# Patient Record
Sex: Male | Born: 1970 | Race: White | Hispanic: No | Marital: Married | Smoking: Never smoker
Health system: Southern US, Community
[De-identification: ages and names within clinical notes are randomized; demographics above are authoritative.]

## PROBLEM LIST (undated history)

## (undated) DIAGNOSIS — Q211 Atrial septal defect: Secondary | ICD-10-CM

## (undated) DIAGNOSIS — Z8673 Personal history of transient ischemic attack (TIA), and cerebral infarction without residual deficits: Secondary | ICD-10-CM

## (undated) DIAGNOSIS — J302 Other seasonal allergic rhinitis: Secondary | ICD-10-CM

## (undated) HISTORY — PX: INGUINAL HERNIA REPAIR: SUR1180

## (undated) HISTORY — DX: Atrial septal defect: Q21.1

## (undated) HISTORY — DX: Personal history of transient ischemic attack (TIA), and cerebral infarction without residual deficits: Z86.73

## (undated) HISTORY — DX: Other seasonal allergic rhinitis: J30.2

---

## 2003-06-16 DIAGNOSIS — Q211 Atrial septal defect: Secondary | ICD-10-CM

## 2003-06-16 DIAGNOSIS — Q2112 Patent foramen ovale: Secondary | ICD-10-CM

## 2003-06-16 HISTORY — DX: Patent foramen ovale: Q21.12

## 2003-06-16 HISTORY — DX: Atrial septal defect: Q21.1

## 2004-03-15 DIAGNOSIS — Z8673 Personal history of transient ischemic attack (TIA), and cerebral infarction without residual deficits: Secondary | ICD-10-CM

## 2004-03-15 HISTORY — DX: Personal history of transient ischemic attack (TIA), and cerebral infarction without residual deficits: Z86.73

## 2004-03-19 DIAGNOSIS — Z8673 Personal history of transient ischemic attack (TIA), and cerebral infarction without residual deficits: Secondary | ICD-10-CM | POA: Insufficient documentation

## 2004-03-27 ENCOUNTER — Encounter: Admission: RE | Admit: 2004-03-27 | Discharge: 2004-03-27 | Payer: Self-pay | Admitting: Internal Medicine

## 2004-03-28 ENCOUNTER — Encounter: Admission: RE | Admit: 2004-03-28 | Discharge: 2004-03-28 | Payer: Self-pay | Admitting: Internal Medicine

## 2004-03-29 ENCOUNTER — Inpatient Hospital Stay (HOSPITAL_COMMUNITY): Admission: EM | Admit: 2004-03-29 | Discharge: 2004-04-01 | Payer: Self-pay | Admitting: Emergency Medicine

## 2004-03-31 ENCOUNTER — Encounter: Payer: Self-pay | Admitting: Cardiology

## 2004-05-07 ENCOUNTER — Ambulatory Visit: Payer: Self-pay | Admitting: Internal Medicine

## 2004-07-15 ENCOUNTER — Ambulatory Visit: Payer: Self-pay | Admitting: Internal Medicine

## 2004-11-20 ENCOUNTER — Ambulatory Visit: Payer: Self-pay | Admitting: Internal Medicine

## 2004-11-28 ENCOUNTER — Emergency Department (HOSPITAL_COMMUNITY): Admission: EM | Admit: 2004-11-28 | Discharge: 2004-11-29 | Payer: Self-pay | Admitting: Emergency Medicine

## 2004-12-03 ENCOUNTER — Ambulatory Visit: Payer: Self-pay | Admitting: Internal Medicine

## 2004-12-09 ENCOUNTER — Ambulatory Visit: Payer: Self-pay

## 2005-01-01 ENCOUNTER — Ambulatory Visit: Payer: Self-pay | Admitting: Internal Medicine

## 2005-01-08 ENCOUNTER — Ambulatory Visit: Payer: Self-pay | Admitting: Internal Medicine

## 2006-08-02 ENCOUNTER — Ambulatory Visit: Payer: Self-pay | Admitting: Internal Medicine

## 2006-09-16 ENCOUNTER — Ambulatory Visit: Payer: Self-pay | Admitting: Internal Medicine

## 2006-10-23 IMAGING — CR DG CHEST 2V
2 series · 2 of 2 positions shown · non-contrast
Comparison: none

CLINICAL DATA: Chest pain.  
 PA AND LATERAL CHEST:

[view not recorded (1 of 2)]
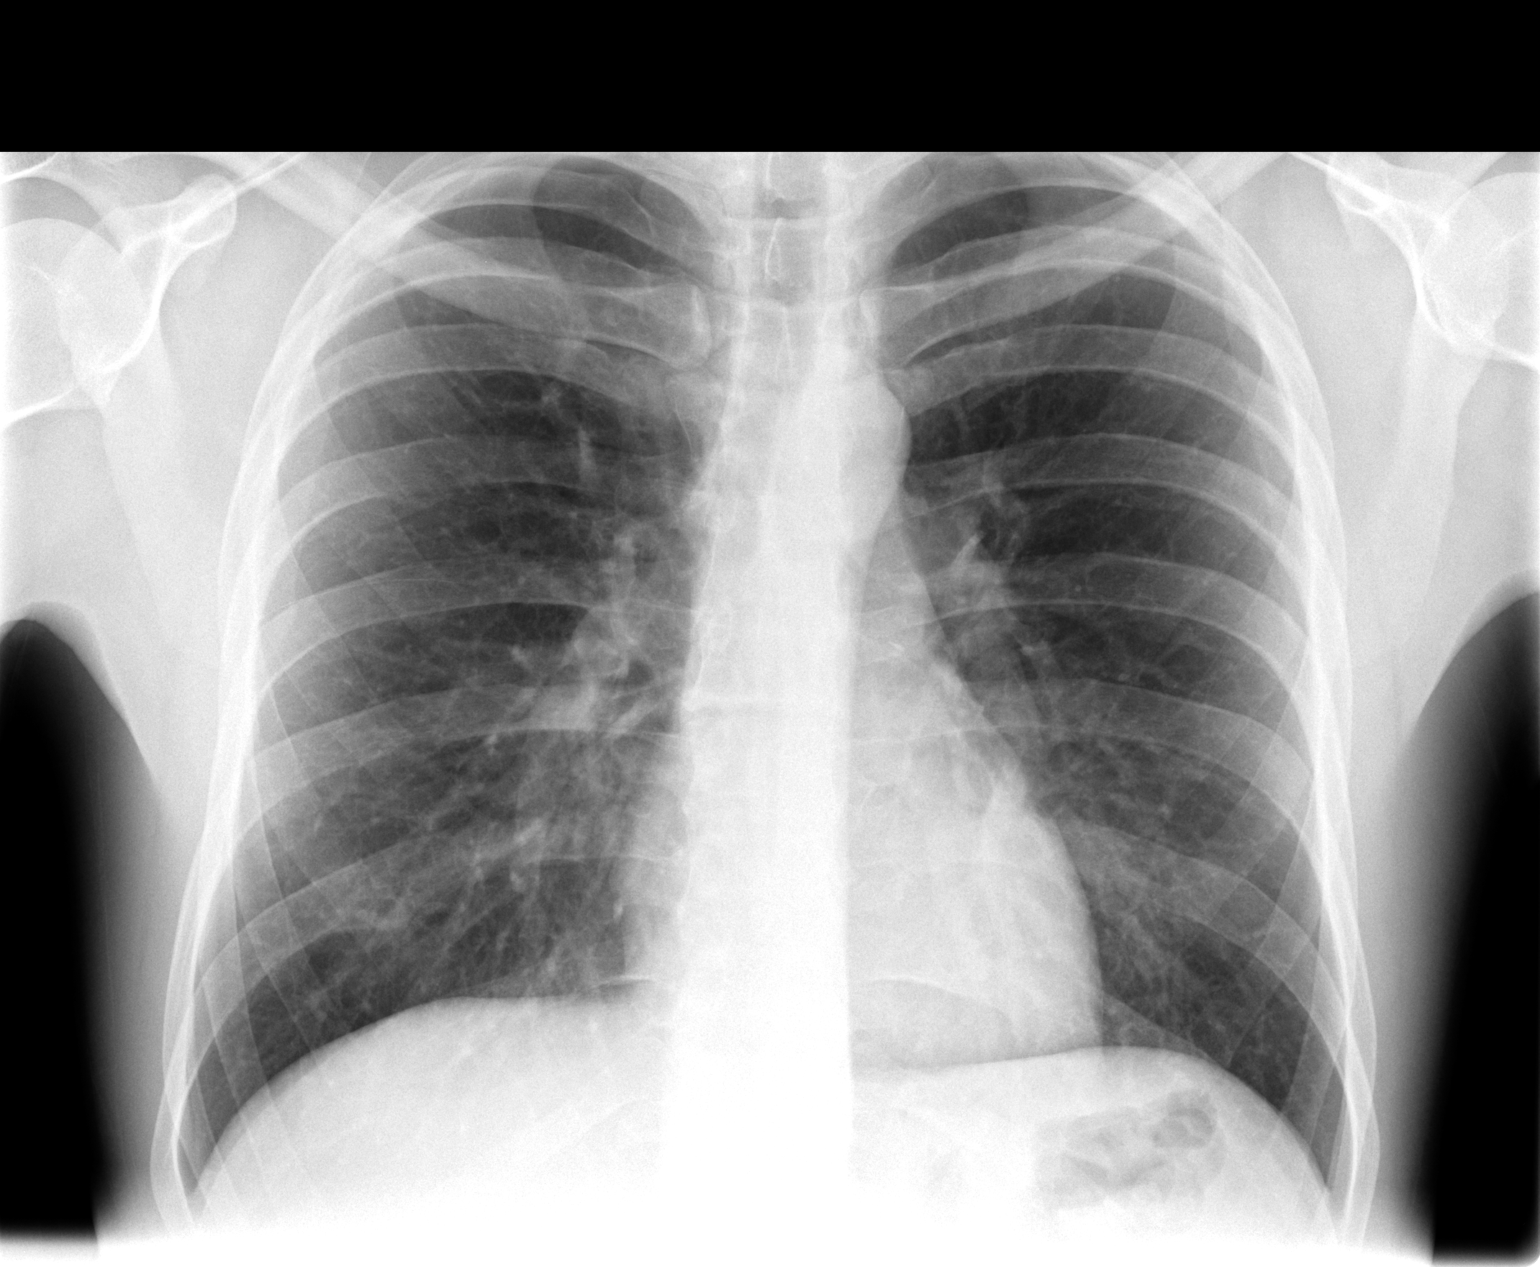

[view not recorded (2 of 2)]
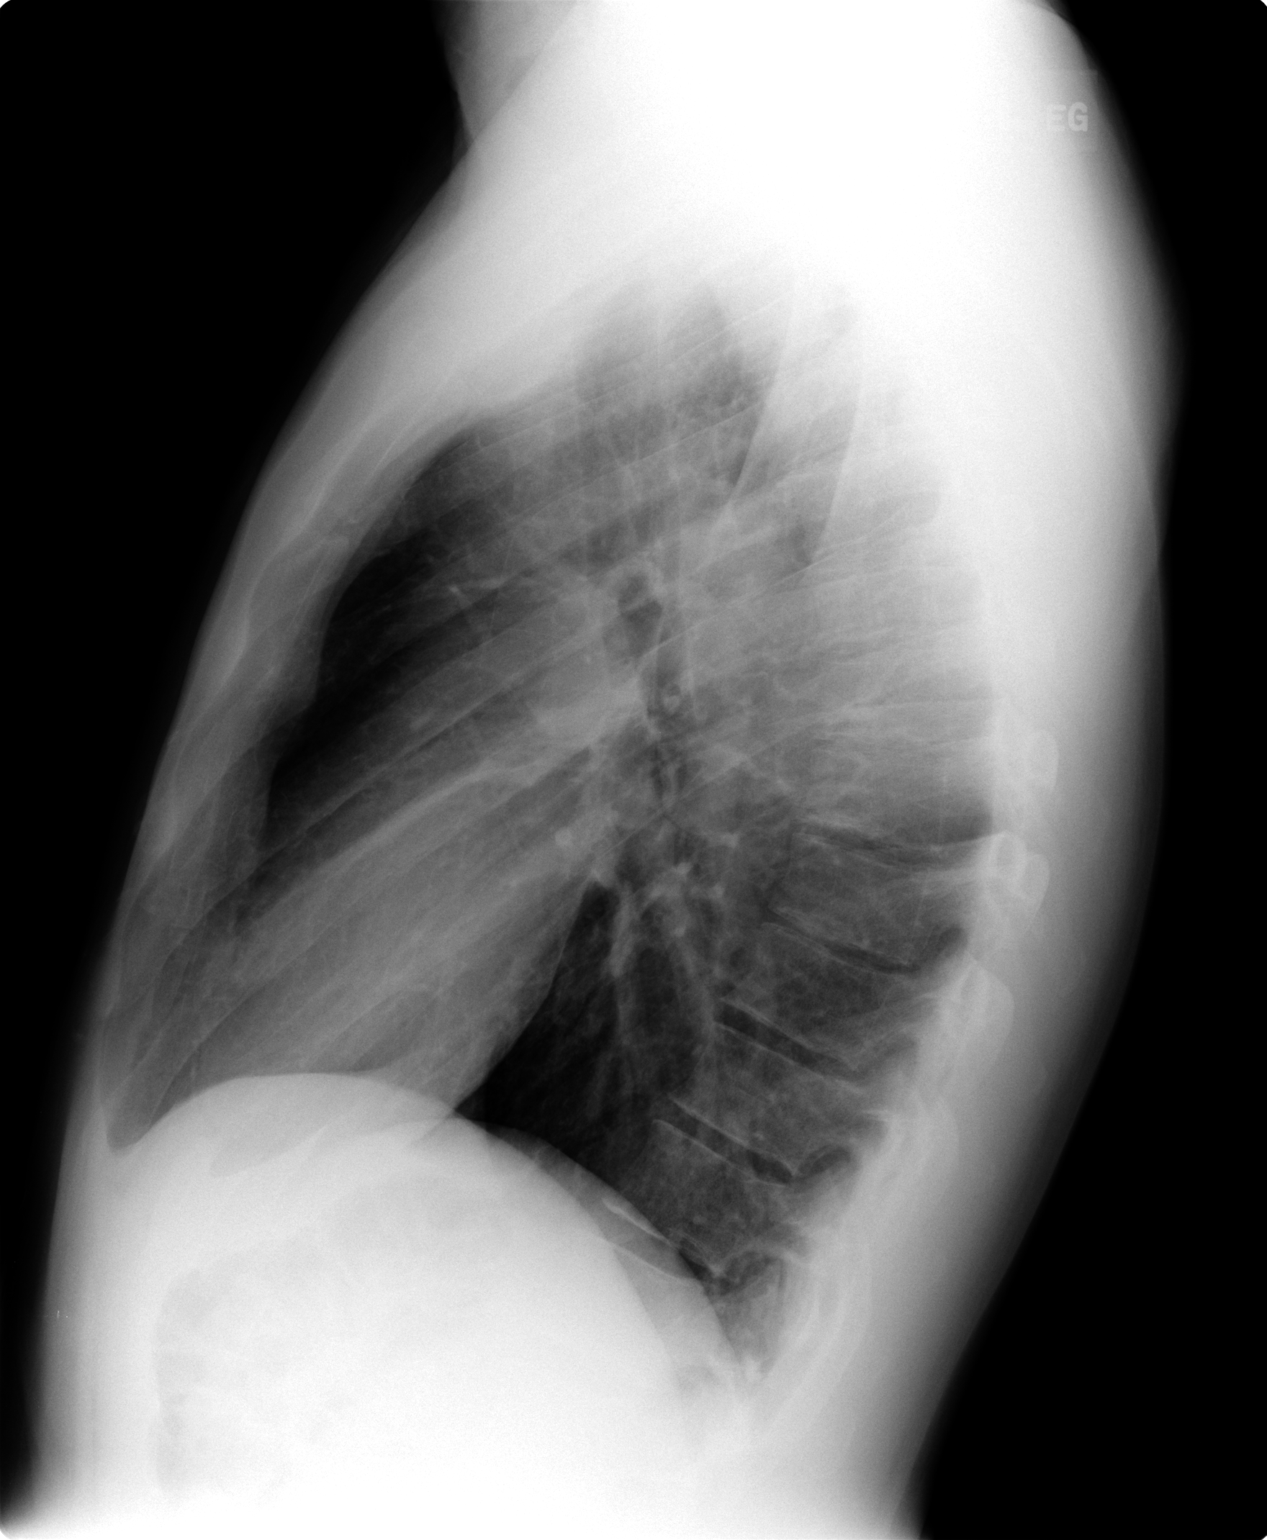

[2 of 2 positions shown; findings below may reference images not displayed]

FINDINGS: Lungs are clear.  Heart size is normal.  No effusion.
IMPRESSION: No acute disease.

## 2006-12-20 ENCOUNTER — Ambulatory Visit: Payer: Self-pay | Admitting: Internal Medicine

## 2006-12-20 DIAGNOSIS — E785 Hyperlipidemia, unspecified: Secondary | ICD-10-CM | POA: Insufficient documentation

## 2006-12-27 ENCOUNTER — Encounter (INDEPENDENT_AMBULATORY_CARE_PROVIDER_SITE_OTHER): Payer: Self-pay | Admitting: *Deleted

## 2008-01-23 ENCOUNTER — Ambulatory Visit: Payer: Self-pay | Admitting: Internal Medicine

## 2008-01-23 DIAGNOSIS — R5383 Other fatigue: Secondary | ICD-10-CM

## 2008-01-23 DIAGNOSIS — R5381 Other malaise: Secondary | ICD-10-CM

## 2008-01-23 DIAGNOSIS — L719 Rosacea, unspecified: Secondary | ICD-10-CM | POA: Insufficient documentation

## 2008-01-30 ENCOUNTER — Encounter (INDEPENDENT_AMBULATORY_CARE_PROVIDER_SITE_OTHER): Payer: Self-pay | Admitting: *Deleted

## 2008-02-08 ENCOUNTER — Encounter (INDEPENDENT_AMBULATORY_CARE_PROVIDER_SITE_OTHER): Payer: Self-pay | Admitting: *Deleted

## 2008-02-08 ENCOUNTER — Ambulatory Visit: Payer: Self-pay | Admitting: Internal Medicine

## 2008-02-08 LAB — CONVERTED CEMR LAB
OCCULT 1: NEGATIVE
OCCULT 2: NEGATIVE
OCCULT 3: NEGATIVE

## 2008-02-16 LAB — CONVERTED CEMR LAB
ALT: 23 units/L (ref 0–53)
AST: 23 units/L (ref 0–37)
Albumin: 4.4 g/dL (ref 3.5–5.2)
Alkaline Phosphatase: 53 units/L (ref 39–117)
BUN: 18 mg/dL (ref 6–23)
Basophils Absolute: 0.2 10*3/uL — ABNORMAL HIGH (ref 0.0–0.1)
Basophils Relative: 3.1 % — ABNORMAL HIGH (ref 0.0–3.0)
Bilirubin, Direct: 0.1 mg/dL (ref 0.0–0.3)
CO2: 30 meq/L (ref 19–32)
Calcium: 9.3 mg/dL (ref 8.4–10.5)
Chloride: 103 meq/L (ref 96–112)
Creatinine, Ser: 1.2 mg/dL (ref 0.4–1.5)
Eosinophils Absolute: 0.3 10*3/uL (ref 0.0–0.7)
Eosinophils Relative: 6.6 % — ABNORMAL HIGH (ref 0.0–5.0)
Free T4: 0.7 ng/dL (ref 0.6–1.6)
GFR calc Af Amer: 88 mL/min
GFR calc non Af Amer: 72 mL/min
Glucose, Bld: 106 mg/dL — ABNORMAL HIGH (ref 70–99)
HCT: 42.4 % (ref 39.0–52.0)
Hemoglobin: 14.7 g/dL (ref 13.0–17.0)
Lymphocytes Relative: 42.3 % (ref 12.0–46.0)
MCHC: 34.8 g/dL (ref 30.0–36.0)
MCV: 94.4 fL (ref 78.0–100.0)
Monocytes Absolute: 0.8 10*3/uL (ref 0.1–1.0)
Monocytes Relative: 19.1 % — ABNORMAL HIGH (ref 3.0–12.0)
Neutro Abs: 1.4 10*3/uL (ref 1.4–7.7)
Neutrophils Relative %: 28.9 % — ABNORMAL LOW (ref 43.0–77.0)
Platelets: 323 10*3/uL (ref 150–400)
Potassium: 4.1 meq/L (ref 3.5–5.1)
RBC: 4.49 M/uL (ref 4.22–5.81)
RDW: 11.9 % (ref 11.5–14.6)
Sodium: 140 meq/L (ref 135–145)
TSH: 3.73 microintl units/mL (ref 0.35–5.50)
Total Bilirubin: 0.9 mg/dL (ref 0.3–1.2)
Total Protein: 7.3 g/dL (ref 6.0–8.3)
WBC: 5 10*3/uL (ref 4.5–10.5)

## 2008-02-28 ENCOUNTER — Ambulatory Visit: Payer: Self-pay | Admitting: Internal Medicine

## 2008-02-29 ENCOUNTER — Encounter (INDEPENDENT_AMBULATORY_CARE_PROVIDER_SITE_OTHER): Payer: Self-pay | Admitting: *Deleted

## 2008-02-29 LAB — CONVERTED CEMR LAB
Basophils Absolute: 0 10*3/uL (ref 0.0–0.1)
Basophils Relative: 0.6 % (ref 0.0–3.0)
Eosinophils Absolute: 0.4 10*3/uL (ref 0.0–0.7)
Eosinophils Relative: 7.1 % — ABNORMAL HIGH (ref 0.0–5.0)
HCT: 43 % (ref 39.0–52.0)
Hemoglobin: 15.1 g/dL (ref 13.0–17.0)
Hgb A1c MFr Bld: 5.2 % (ref 4.6–6.0)
Lymphocytes Relative: 34.7 % (ref 12.0–46.0)
MCHC: 35.1 g/dL (ref 30.0–36.0)
MCV: 93.5 fL (ref 78.0–100.0)
Monocytes Absolute: 0.6 10*3/uL (ref 0.1–1.0)
Monocytes Relative: 11.2 % (ref 3.0–12.0)
Neutro Abs: 2.5 10*3/uL (ref 1.4–7.7)
Neutrophils Relative %: 46.4 % (ref 43.0–77.0)
Platelets: 299 10*3/uL (ref 150–400)
RBC: 4.6 M/uL (ref 4.22–5.81)
RDW: 12 % (ref 11.5–14.6)
WBC: 5.3 10*3/uL (ref 4.5–10.5)

## 2010-07-13 LAB — CONVERTED CEMR LAB
ALT: 20 units/L (ref 0–53)
AST: 20 units/L (ref 0–37)
Albumin: 4.4 g/dL (ref 3.5–5.2)
Alkaline Phosphatase: 50 units/L (ref 39–117)
BUN: 13 mg/dL (ref 6–23)
Basophils Absolute: 0 10*3/uL (ref 0.0–0.1)
Basophils Relative: 0 % (ref 0.0–1.0)
Bilirubin, Direct: 0.1 mg/dL (ref 0.0–0.3)
CO2: 34 meq/L — ABNORMAL HIGH (ref 19–32)
Calcium: 9.2 mg/dL (ref 8.4–10.5)
Chloride: 106 meq/L (ref 96–112)
Cholesterol: 167 mg/dL (ref 0–200)
Creatinine, Ser: 1.1 mg/dL (ref 0.4–1.5)
Eosinophils Absolute: 0.3 10*3/uL (ref 0.0–0.6)
Eosinophils Relative: 5.2 % — ABNORMAL HIGH (ref 0.0–5.0)
GFR calc Af Amer: 98 mL/min
GFR calc non Af Amer: 81 mL/min
Glucose, Bld: 101 mg/dL — ABNORMAL HIGH (ref 70–99)
HCT: 44.5 % (ref 39.0–52.0)
HDL: 37.6 mg/dL — ABNORMAL LOW (ref >39.0)
Hemoglobin: 15 g/dL (ref 13.0–17.0)
LDL Cholesterol: 111 mg/dL — ABNORMAL HIGH (ref 0–99)
Lymphocytes Relative: 31.9 % (ref 12.0–46.0)
MCHC: 33.8 g/dL (ref 30.0–36.0)
MCV: 93 fL (ref 78.0–100.0)
Monocytes Absolute: 0.5 10*3/uL (ref 0.2–0.7)
Monocytes Relative: 10.2 % (ref 3.0–11.0)
Neutro Abs: 2.7 10*3/uL (ref 1.4–7.7)
Neutrophils Relative %: 52.7 % (ref 43.0–77.0)
Platelets: 304 10*3/uL (ref 150–400)
Potassium: 4.1 meq/L (ref 3.5–5.1)
RBC: 4.78 M/uL (ref 4.22–5.81)
RDW: 11.9 % (ref 11.5–14.6)
Sodium: 142 meq/L (ref 135–145)
TSH: 5.18 microintl units/mL (ref 0.35–5.50)
Total Bilirubin: 1.1 mg/dL (ref 0.3–1.2)
Total CHOL/HDL Ratio: 4.4
Total Protein: 7.4 g/dL (ref 6.0–8.3)
Triglycerides: 92 mg/dL (ref 0–149)
VLDL: 18 mg/dL (ref 0–40)
WBC: 5.2 10*3/uL (ref 4.5–10.5)

## 2010-10-31 NOTE — Discharge Summary (Signed)
NAME:  Andrew [name], Andrew [name]                 ACCOUNT NO.:  [id number]   MEDICAL RECORD NO.:  [id number]          PATIENT TYPE:  INP   LOCATION:  3011                         FACILITY:  [facility]   PHYSICIAN:  Rene Paci, M.D. LHCDATE OF BIRTH:  Jun 25, 1970   DATE OF ADMISSION:  03/29/2004  DATE OF DISCHARGE:  04/01/2004                                 DISCHARGE SUMMARY   DISCHARGE DIAGNOSES:  1.  Left mid brain small infarct.  2.  Small patent foramen ovale.  3.  Hyperglycemia.  4.  Elevated triglycerides.   BRIEF ADMISSION HISTORY:  Andrew [name] is a 41 year old white male who started  having symptoms of left facial numbness and tingling in his left fingertips.   PAST MEDICAL HISTORY:  1.  Hypothyroidism.  2.  Inguinal hernia repair.   HOSPITAL COURSE:  Problem 1. Neurological.  The patient presented with left  facial numbness and weakness.  The patient underwent MRI that revealed right  lateral medullary lesion.  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  MRI of brain showed no aneurysms or basilar disease.  His sed  rate, RPR, ANA, FLP, TSH, and homocysteine levels were all normal.  The  patient did undergo a cerebral arteriogram that revealed no dissection,  stenosis, occlusion, AVMs, or aneurysm.  It was suspected that he had a left  mid brain small infarct, the etiology was not known.  Unrelated, the  patient's PEE did reveal a PFO but it was not felt to be the source of his  stroke.   LABS AT DISCHARGE:  Hemoglobin 15.1.  Sed rate was 3.  Coags were normal.  Triglycerides were elevated at 152.   DISCHARGE MEDICATIONS:  1.  Aggrenox one tablet q.h.s. x2 weeks, then start b.i.d.  2.  Synthroid as at home.   Follow up with Dr. Pearlean Brownie in about 6 to 8 weeks and follow up with Dr. Alwyn Ren  as previously scheduled.      LC/MEDQ  D:  07/21/2004  T:  07/21/2004  Job:  [id number]

## 2010-10-31 NOTE — Consult Note (Signed)
NAME:  Andrew [name], Andrew [name]                 ACCOUNT NO.:  [id number]   MEDICAL RECORD NO.:  [id number]          PATIENT TYPE:  INP   LOCATION:  3011                         FACILITY:  [facility]   PHYSICIAN:  Pramod P. Pearlean Brownie, MD    DATE OF BIRTH:  1970-08-08   DATE OF CONSULTATION:  03/31/2004  DATE OF DISCHARGE:                                   CONSULTATION   REASON FOR CONSULTATION:  This 40 year old male who developed the sudden  onset of left face, hand and foot paresthesias in a cheiro-oral pattern one  week ago.   HISTORY OF PRESENT ILLNESS:  The patient states that he was sleeping on  Sunday night in an awkward position on his side with his neck extended.  When he woke up in the morning, he noticed some numbness involving the left  side of his lips and cheeks, as well as his fingertips and the bottom of his  left foot.  He has persisted since then, though it has improved slightly  over the last couple of days.  Today he has only paresthesia effecting his  left fingertips.  He also noted that his balance and equilibrium were off in  the first couple of days, but this seems to be improving.  He denies any  significant headache, neck injury, trauma, no prolonged bout of coughing or  deep venous thrombosis.  There is no family history of strokes or myocardial  infarctions at a young age.  He has had no abnormal clotting tendencies.   PAST MEDICAL HISTORY:  Significant for hypothyroidism.   PAST SURGICAL HISTORY:  Inguinal hernia.   FAMILY HISTORY:  Not significant for any relatives with strokes or  myocardial infarctions at a young age.   SOCIAL HISTORY:  He does not smoke, drink, or do drugs.   MEDICATIONS:  Synthroid.   REVIEW OF SYSTEMS:  Not significant for any recent fever, cough, chest pain,  diarrhea.   PHYSICAL EXAMINATION:  GENERAL:  A pleasant, well-built young Caucasian  male, not in distress.  VITAL SIGNS:  He is afebrile, pulse 80 per minute and regular, blood  pressure  113/76, respirations 20 per minute, saturation 98% on room air.  HEENT:  Head atraumatic.  NECK:  Supple without bruits.  NEUROLOGIC:  The patient is awake, alert and oriented x3.  Normal speech and  language function.  No seizure or apraxia or dysarthria.  Pupils equal,  reactive to light and accommodation.  Visual acuity appears adequate.  Face  is symmetrical.  Palate is normal.  Tongue is midline.  Motor system  examination reveals symmetric upper and lower extremities,  strength, tone,  reflexes coordination and sensation.  He has some subjective paresthesia in  the left fingertips without objective sensory loss.  Finger-to-nose and knee-  to-heel coordination are not recorded, due to not being tested.   DATA:  An MRI scan of the brain done yesterday reveals a small patchy  infarction of the right lateral mid-brain.  The MRA reveals no significant  stenosis of the vertebral basilar artery.  There is mild signal loss in  both  the supraclinoid ICA and the left carotid bifurcation.   LABORATORY DATA:  The hypercoagulable labs and lipid profile are pending.   IMPRESSION:  A 40 year old male with right mid-brain infarction, of  undetermined etiology at this point.  He has no significant prior risk  factors.  A small dissection of the right vertebral basilar artery is  certainly a possibility; however, paroxysmal embolism and insult are to be  considered, consider his young age.   PLAN:  A transcranial Doppler a bubble study was performed after a verbal  informed consent from the patient, explaining the risks and benefits.  The  right middle cerebral artery was __________ and the wave form appeared  normal.  A mixture of __________ saline was injected into the IV which had  previously been inserted into the left forearm.  A high intensity transient  signal or HIDS hits were noted at rest.  These increased when the patient  was asked to perform a Valsalva maneuver.  The maneuver was  repeated twice  and the results were confirmed.  The TCD bubble study is strongly positive,  indicative of an abnormal right to left intracardiac communication at rest.  I discussed the results with the patient and with his wife, and answered  questions.  The patient is scheduled to undergo a transesophageal  echocardiogram to confirm these results, and to further delineate the  anatomy of the inferior third septum.  I discussed in brief the medical  treatment versus endovascular closure of the above with the family, but this  can be done electively.  The rest of the stroke workup is pending.  I would  recommend doing a lower extremity venous Doppler, to rule out a deep venous  thrombosis, and if this is positive, the patient may need to be on  anticoagulation with heparin and Coumadin.  Thank you for this referral.       PPS/MEDQ  D:  03/31/2004  T:  03/31/2004  Job:  [id number]

## 2010-10-31 NOTE — H&P (Signed)
NAME:  Andrew [name], Andrew [name]                 ACCOUNT NO.:  [id number]   MEDICAL RECORD NO.:  [id number]          PATIENT TYPE:  EMS   LOCATION:  MAJO                         FACILITY:  [facility]   PHYSICIAN:  Thomos Lemons, D.O. [facility]   DATE OF BIRTH:  [birth year]   DATE OF ADMISSION:  03/29/2004  DATE OF DISCHARGE:                                HISTORY & PHYSICAL   PRIMARY CARE DOCTOR:  Dr. Alwyn Ren.   CHIEF COMPLAINT:  Left facial, hand and leg numbness.   HISTORY OF PRESENT ILLNESS:  The patient is a 40 year old white male with a  past medical history of hypothyroidism who started having symptoms of left  facial numbness, tingling of his left fingers, and also left leg numbness  after taking a nap on the couch approximately one week ago on Sunday.  The  patient states that his symptoms persisted x 1 week.  Initially it was felt  that there may have been a pinched nerve in his neck.  However, the patient  was sent for an MRI of his brain yesterday, and I was called by the  radiologist today regarding a finding of an acute mesencephalic lesion  infarct, 1 cm, today, and infarct seems to be consistent with patient's  symptoms.   PAST MEDICAL HISTORY:  Only significant for hypothyroidism.  The patient  denies a history of diabetes or hypertension or any cardiac disease.   PAST SURGICAL HISTORY:  Inguinal hernia repair as an infant.   FAMILY HISTORY:  Only significant for a brother who has diabetes.   SOCIAL HISTORY:  The patient denies any tobacco or alcohol use and denies  any drug use.  He is married, has one young daughter, and his occupation is  works for the [employer], community and also does some occasional  Soil scientist.  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   ALLERGIES:  THE PATIENT DENIES ANY KNOWN DRUG ALLERGIES.   MEDICATIONS:  1.  Synthroid 0.5 mg on Tuesday, Thursday, and Saturdays and a half tab on      Mondays, Wednesdays, Fridays and  Sunday.  2.  The patient denies any over-the-counter or herbal medications.   REVIEW OF SYSTEMS:  This patient denies any history of fevers, chills,  headache.  No visual changes.  No chest pain, shortness of breath.  No  nausea, vomiting, constipation, or diarrhea.  There are no dark stools, no  blood in the stools, no dysuria, no hematuria.  All other systems are  negative.  The patient in the past had symptoms of excess fatigue at which  time hypothyroidism was diagnosed and the patient was started on Synthroid.  The patient also does note some mild unsteadiness of his gait but no falls.   Vitals were not available at the time of dictation.   MRI results showed 1 cm right cephalic lesion, acute infarct.   PHYSICAL EXAMINATION:  GENERAL:  The patient is a very pleasant, well-  developed, well-nourished, 40 year old white male in no acute distress.  HEENT:  Pupils were equal and reactive to  light.  Extraocular motility  intact.  No nystagmus could be appreciated.  No carotid bruit.  No  thyromegaly or thyroid nodules were appreciated.  LUNG:  Chest was clear to auscultation bilaterally.  CARDIOVASCULAR:  Regular rate.  No significant murmurs, rubs or gallops were  appreciated.  ABDOMINAL:  Abdomen was soft, nontender.  Positive bowel sounds.  No  organomegaly.  EXTREMITIES:  No clubbing, cyanosis or edema.  NEUROLOGICAL:  A neurologic exam was performed.  The patient has numbness  over his left cheek.  There is no appreciable facial asymmetry.  The patient  had a symmetric oropharynx with a midline uvula.  The patient's speech was  normal without a notice of slurring and the muscle strength was 5/5  throughout upper and lower extremities.  Reflexes were +2 brachial, +2-3  patellar, +1 Achilles bilaterally, and toes were downgoing bilaterally.  There were no cerebellar signs, no pronator drift.  The patient had a  negative Romberg and gait was essentially normal.    ASSESSMENT/PLAN:  1.  Acute mesencephalic stroke in a 40 year old with a history of      hypothyroidism.  Will check a stroke workup which will include a fasting      lipid profile, a TSH, RPR, ANA, homocystine level, ESR.  We will also      check carotid Dopplers, a 2-D echo of his heart.  We will obtain      transcranial Dopplers and also an MRA of his head and neck regarding      stroke.  In addition, Dr. Thad Ranger of [facility] was      consulted and will evaluate the patient during this hospitalization.  2.  History of hypothyroidism.  Check TSH, maintain current dose of      Synthroid for now.       RY/MEDQ  D:  03/29/2004  T:  03/29/2004  Job:  [id number]   cc:   Titus Dubin. Alwyn Ren, M.D. [facility]

## 2010-10-31 NOTE — Consult Note (Signed)
NAME:  Andrew [name], Andrew [name]                 ACCOUNT NO.:  [id number]   MEDICAL RECORD NO.:  [id number]          PATIENT TYPE:  INP   LOCATION:  3011                         FACILITY:  [facility]   PHYSICIAN:  Casimiro Needle L. Reynolds, M.D.DATE OF BIRTH:  April 07, 1971   DATE OF CONSULTATION:  03/29/2004  DATE OF DISCHARGE:                                   CONSULTATION   CONSULTING PHYSICIAN:  Casimiro Needle L. Thad Ranger, M.D.   REQUESTING PHYSICIAN:  Thomos Lemons, D.O. [facility].   REASON FOR EVALUATION:  Stroke.   HISTORY OF PRESENT ILLNESS:  This is the initial inpatient consultation  evaluation of this 40 year old man with a past medical history significant  for hypothyroidism.  The patient reports that he awoke from a nap five days  ago with a numb sensation involving the left side of his face, particularly  his cheek, as well as his hand, and his foot.  He stood up off of the sofa  where he has been sleeping and felt a little bit dizzy and almost fell.  The  following minutes to hours, his gait improved somewhat, although he still  feels a little bit unsteady and his foot numbness improved but he still has  resistant numb sensations involving the left cheek and hand.  He says that  this feels like an anesthetic at the dentist wearing off.  He denies any  associated weakness on the left side, any slurred speech, visual changes.  There is no associated headache, nausea, chest pain, shortness of breath, or  palpitations, or loss of consciousness.  He presented to his primary  physician's office with his complaints and subsequently underwent a MRI of  the brain which was performed today and revealed a stroke in the area of the  right cerebral peduncle and he is admitted for further workup of this.  He  says that his symptoms above he has been feeling well except for some  generalized fatigue going on for the past few months.  The patient does  report that when he awoke from the nap in question, he had been asleep  with  his head firmly flexed against the side of the sofa and wonders if he might  have done something to his neck at that time.   PAST MEDICAL HISTORY:  1.  Remarkable for hypothyroidism as above, on replacement.  2.  He also says that his LDL is a little bit high.  He denies chronic      medical problems.   Family, social, review of systems as outlined in the admission H&P on  March 29, 2004.  In particular negative for any early onset stroke or  coronary disease.  He does not smoke.   MEDICATIONS:  Prior to admission he was taking only Synthroid.  Here in the  hospital he has also been placed on aspirin and Plavix.   PHYSICAL EXAMINATION:  VITAL SIGNS:  Temperature 98.7, blood pressure  122/96, pulse 90, respirations 20, O2 sat 98% on room air.  GENERAL:  This is a healthy-appearing man, seated, in no evident distress.  He is tall  and has a bit of a __________ appearance.  HEAD:  Cranium is normocephalic and atraumatic.  THROAT:  Oropharynx is benign.  NECK:  Supple without carotid or supraclavicular bruits.  HEART:  Regular rate and rhythm without murmurs.  NEUROLOGIC:  Mental status:  He is awake, and alert, and fully oriented to  time, place, and person.  Recent and remote memory are intact.  Attention  span, concentration, and fund of knowledge are appropriate.  Speech is slow  and not dysarthric.  There are no defects to confrontational naming and he  can repeat a phrase.  Mood is euthymic and affect appropriate.  Cranial  nerves:  Fundi are poorly visualized.  Pupils are equal and reactive to  accommodation.  Extraocular movements are full without nystagmus.  Visual  fields are full to confrontation.  Hearing is intact to conversational  speech.  Facial sensation is intact to pinprick.  Face, tongue, and palate  move normally and symmetrically.  Shoulder shrug strength is normal.  Motor  testing:  Normal bulk and tone.  Normal strength in all tested extremity  muscles.   Sensation intact to light touch, pinprick, and vibration all  extremities including the area subjectively effected by the sensory changes.  Coordination:  Rapid-alternating-movements are performed well.  Finger-to-  nose is performed well.  Gait:  He rises from a chair easily and his stance  is normal.  He was able to heel toe and tandem walk without difficulty.  Reflexes 2+ and symmetric.  Toes are downgoing.   LABORATORY REVIEW:  Numerous labs are pending at this time.  I did review  the MRI of the brain performed earlier today.  I would agree with the  interpretation that there is what appear to be a subacute stroke in the area  of the right peduncle extending down into the mid brain.  There is no other  apparent abnormality on the study.  MRA was not performed.   IMPRESSION:  Presumed stroke in a 40 year old man with no obvious risk  factors.  The etiology is uncertain and he will need an extensive workup.  We need to rule out vascular abnormality, blood-clotting abnormality,  cardiac source, etcetera.  It is possible that the extreme neck flexion  during his nap played a role.   PLAN:  1.  MRA of the intracranial and extracranial circulation.  He may also need      a cath angiogram.  2.  A 2D echocardiogram, consider transesophageal echo.  3.  Carotid and transcranial Dopplers.  4.  Stroke labs including an extensive hypercoagulable workup to include      activated protein C and protein S levels, antithrombin III levels,      antiphospholipid, and anticardiolipin antibodies, and lupus      anticoagulant, as well as a sed rate, TSH, ANA, lipid panel, and      homocystine level.  Consider also checking for prothrombin gene mutation      and Factor V Leiden abnormalities.  5.  We will discontinue Plavix and continue on aspirin only at this time.   Thank you for the consultation.  Stroke service will follow.      MLR/MEDQ  D:  03/29/2004  T:  03/29/2004  Job:  [id number]

## 2016-08-04 ENCOUNTER — Ambulatory Visit (INDEPENDENT_AMBULATORY_CARE_PROVIDER_SITE_OTHER): Payer: BC Managed Care – PPO | Admitting: Family Medicine

## 2016-08-04 ENCOUNTER — Encounter: Payer: Self-pay | Admitting: Family Medicine

## 2016-08-04 VITALS — BP 120/82 | HR 78 | Temp 98.4°F | Ht 75.0 in | Wt 226.1 lb

## 2016-08-04 DIAGNOSIS — Q2112 Patent foramen ovale: Secondary | ICD-10-CM | POA: Insufficient documentation

## 2016-08-04 DIAGNOSIS — Z23 Encounter for immunization: Secondary | ICD-10-CM | POA: Diagnosis not present

## 2016-08-04 DIAGNOSIS — Q211 Atrial septal defect: Secondary | ICD-10-CM | POA: Diagnosis not present

## 2016-08-04 DIAGNOSIS — E785 Hyperlipidemia, unspecified: Secondary | ICD-10-CM

## 2016-08-04 DIAGNOSIS — Z0000 Encounter for general adult medical examination without abnormal findings: Secondary | ICD-10-CM | POA: Insufficient documentation

## 2016-08-04 DIAGNOSIS — Z8673 Personal history of transient ischemic attack (TIA), and cerebral infarction without residual deficits: Secondary | ICD-10-CM

## 2016-08-04 DIAGNOSIS — Z125 Encounter for screening for malignant neoplasm of prostate: Secondary | ICD-10-CM

## 2016-08-04 NOTE — Patient Instructions (Addendum)
Start aspirin 35m enteric coated daily.  Tdap today (tetanus and whooping cough).  Return at your convenience for fasting labs.  We will request latest note from Dr SLeonie Man  Return as needed or in 1 year for next physical.   Health Maintenance, Male A healthy lifestyle and preventative care can promote health and wellness.  Maintain regular health, dental, and eye exams.  Eat a healthy diet. Foods like vegetables, fruits, whole grains, low-fat dairy products, and lean protein foods contain the nutrients you need and are low in calories. Decrease your intake of foods high in solid fats, added sugars, and salt. Get information about a proper diet from your health care provider, if necessary.  Regular physical exercise is one of the most important things you can do for your health. Most adults should get at least 150 minutes of moderate-intensity exercise (any activity that increases your heart rate and causes you to sweat) each week. In addition, most adults need muscle-strengthening exercises on 2 or more days a week.   Maintain a healthy weight. The body mass index (BMI) is a screening tool to identify possible weight problems. It provides an estimate of body fat based on height and weight. Your health care provider can find your BMI and can help you achieve or maintain a healthy weight. For males 20 years and older:  A BMI below 18.5 is considered underweight.  A BMI of 18.5 to 24.9 is normal.  A BMI of 25 to 29.9 is considered overweight.  A BMI of 30 and above is considered obese.  Maintain normal blood lipids and cholesterol by exercising and minimizing your intake of saturated fat. Eat a balanced diet with plenty of fruits and vegetables. Blood tests for lipids and cholesterol should begin at age 4353and be repeated every 5 years. If your lipid or cholesterol levels are high, you are over age 46 or you are at high risk for heart disease, you may need your cholesterol levels checked  more frequently.Ongoing high lipid and cholesterol levels should be treated with medicines if diet and exercise are not working.  If you smoke, find out from your health care provider how to quit. If you do not use tobacco, do not start.  Lung cancer screening is recommended for adults aged 569-80years who are at high risk for developing lung cancer because of a history of smoking. A yearly low-dose CT scan of the lungs is recommended for people who have at least a 30-pack-year history of smoking and are current smokers or have quit within the past 15 years. A pack year of smoking is smoking an average of 1 pack of cigarettes a day for 1 year (for example, a 30-pack-year history of smoking could mean smoking 1 pack a day for 30 years or 2 packs a day for 15 years). Yearly screening should continue until the smoker has stopped smoking for at least 15 years. Yearly screening should be stopped for people who develop a health problem that would prevent them from having lung cancer treatment.  If you choose to drink alcohol, do not have more than 2 drinks per day. One drink is considered to be 12 oz (360 mL) of beer, 5 oz (150 mL) of wine, or 1.5 oz (45 mL) of liquor.  Avoid the use of street drugs. Do not share needles with anyone. Ask for help if you need support or instructions about stopping the use of drugs.  High blood pressure causes heart disease and increases  the risk of stroke. High blood pressure is more likely to develop in:  People who have blood pressure in the end of the normal range (100-139/85-89 mm Hg).  People who are overweight or obese.  People who are African American.  If you are 76-36 years of age, have your blood pressure checked every 3-5 years. If you are 53 years of age or older, have your blood pressure checked every year. You should have your blood pressure measured twice-once when you are at a hospital or clinic, and once when you are not at a hospital or clinic. Record  the average of the two measurements. To check your blood pressure when you are not at a hospital or clinic, you can use:  An automated blood pressure machine at a pharmacy.  A home blood pressure monitor.  If you are 22-50 years old, ask your health care provider if you should take aspirin to prevent heart disease.  Diabetes screening involves taking a blood sample to check your fasting blood sugar level. This should be done once every 3 years after age 71 if you are at a normal weight and without risk factors for diabetes. Testing should be considered at a younger age or be carried out more frequently if you are overweight and have at least 1 risk factor for diabetes.  Colorectal cancer can be detected and often prevented. Most routine colorectal cancer screening begins at the age of 1 and continues through age 33. However, your health care provider may recommend screening at an earlier age if you have risk factors for colon cancer. On a yearly basis, your health care provider may provide home test kits to check for hidden blood in the stool. A small camera at the end of a tube may be used to directly examine the colon (sigmoidoscopy or colonoscopy) to detect the earliest forms of colorectal cancer. Talk to your health care provider about this at age 19 when routine screening begins. A direct exam of the colon should be repeated every 5-10 years through age 65, unless early forms of precancerous polyps or small growths are found.  People who are at an increased risk for hepatitis B should be screened for this virus. You are considered at high risk for hepatitis B if:  You were born in a country where hepatitis B occurs often. Talk with your health care provider about which countries are considered high risk.  Your parents were born in a high-risk country and you have not received a shot to protect against hepatitis B (hepatitis B vaccine).  You have HIV or AIDS.  You use needles to inject  street drugs.  You live with, or have sex with, someone who has hepatitis B.  You are a man who has sex with other men (MSM).  You get hemodialysis treatment.  You take certain medicines for conditions like cancer, organ transplantation, and autoimmune conditions.  Hepatitis C blood testing is recommended for all people born from [birth year] through 1965 and any individual with known risk factors for hepatitis C.  Healthy men should no longer receive prostate-specific antigen (PSA) blood tests as part of routine cancer screening. Talk to your health care provider about prostate cancer screening.  Testicular cancer screening is not recommended for adolescents or adult males who have no symptoms. Screening includes self-exam, a health care provider exam, and other screening tests. Consult with your health care provider about any symptoms you have or any concerns you have about testicular cancer.  Practice  safe sex. Use condoms and avoid high-risk sexual practices to reduce the spread of sexually transmitted infections (STIs).  You should be screened for STIs, including gonorrhea and chlamydia if:  You are sexually active and are younger than 24 years.  You are older than 24 years, and your health care provider tells you that you are at risk for this type of infection.  Your sexual activity has changed since you were last screened, and you are at an increased risk for chlamydia or gonorrhea. Ask your health care provider if you are at risk.  If you are at risk of being infected with HIV, it is recommended that you take a prescription medicine daily to prevent HIV infection. This is called pre-exposure prophylaxis (PrEP). You are considered at risk if:  You are a man who has sex with other men (MSM).  You are a heterosexual man who is sexually active with multiple partners.  You take drugs by injection.  You are sexually active with a partner who has HIV.  Talk with your health care provider  about whether you are at high risk of being infected with HIV. If you choose to begin PrEP, you should first be tested for HIV. You should then be tested every 3 months for as long as you are taking PrEP.  Use sunscreen. Apply sunscreen liberally and repeatedly throughout the day. You should seek shade when your shadow is shorter than you. Protect yourself by wearing long sleeves, pants, a wide-brimmed hat, and sunglasses year round whenever you are outdoors.  Tell your health care provider of new moles or changes in moles, especially if there is a change in shape or color. Also, tell your health care provider if a mole is larger than the size of a pencil eraser.  A one-time screening for abdominal aortic aneurysm (AAA) and surgical repair of large AAAs by ultrasound is recommended for men aged 65-75 years who are current or former smokers.  Stay current with your vaccines (immunizations). This information is not intended to replace advice given to you by your health care provider. Make sure you discuss any questions you have with your health care provider. Document Released: 11/28/2007 Document Revised: 06/22/2014 Document Reviewed: 03/05/2015 Elsevier Interactive Patient Education  2017 [employer].

## 2016-08-04 NOTE — Progress Notes (Signed)
Pre visit review using our clinic review tool, if applicable. No additional management support is needed unless otherwise documented below in the visit note. 

## 2016-08-04 NOTE — Progress Notes (Signed)
BP 120/82 (BP Location: Right Arm, Cuff Size: Large)   Pulse 78   Temp 98.4 F (36.9 C) (Oral)   Ht 6' 3"  (1.905 m)   Wt 226 lb 1.9 oz (102.6 kg)   SpO2 97%   BMI 28.26 kg/m    CC: new pt to establish care Subjective:    Patient ID: Andrew [name], male    DOB: 1970-12-23, 46 y.o.   MRN: [id number]  HPI: Andrew [name] is a 46 y.o. male presenting on 08/04/2016 for New Patient (Initial Visit) (Establishing care)   Prior saw Dr Linna Darner.   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 ?PFO related - he did complete transcranial doppler bubble study suspicious for this but it doesn't seem he completed TEE. He did take aggrenox shortly but this caused severe headaches so he stopped. He did see neurology for this as well.   Seasonal allergic rhinitis - treated with nasacort and OTC antihistamine.   Preventative: Flu shot - declines Tdap today Seat belt use discussed Sunscreen use discussed. No changing moles on skin. 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 Interim medical history since our last visit reviewed. Allergies and medications reviewed and updated.  No outpatient prescriptions prior to visit.   No facility-administered medications prior to visit.      Per HPI unless specifically indicated in ROS section below Review of Systems  Constitutional: Negative for activity change, appetite change, chills, fatigue, fever and unexpected weight change.  HENT: Negative for hearing loss.   Eyes: Negative for visual disturbance.  Respiratory: Negative for cough, chest tightness, shortness of breath and wheezing.   Cardiovascular:  Negative for chest pain, palpitations and leg swelling.  Gastrointestinal: Negative for abdominal distention, abdominal pain, blood in stool, constipation, diarrhea, nausea and vomiting.  Genitourinary: Negative for difficulty urinating and hematuria.  Musculoskeletal: Negative for arthralgias, myalgias and neck pain.  Skin: Negative for rash.  Neurological: Negative for dizziness, seizures, syncope and headaches.  Hematological: Negative for adenopathy. Does not bruise/bleed easily.  Psychiatric/Behavioral: Negative for dysphoric mood. The patient is not nervous/anxious.        Objective:    BP 120/82 (BP Location: Right Arm, Cuff Size: Large)   Pulse 78   Temp 98.4 F (36.9 C) (Oral)   Ht 6' 3"  (1.905 m)   Wt 226 lb 1.9 oz (102.6 kg)   SpO2 97%   BMI 28.26 kg/m   Wt Readings from Last 3 Encounters:  08/04/16 226 lb 1.9 oz (102.6 kg)  01/23/08 213 lb 3.2 oz (96.7 kg)  12/20/06 209 lb 8 oz (95 kg)    Physical Exam  Constitutional: He is oriented to person, place, and time. He appears well-developed and well-nourished. No distress.  HENT:  Head: Normocephalic and atraumatic.  Right Ear: Hearing, tympanic membrane, external ear and ear canal normal.  Left Ear: Hearing, tympanic membrane, external ear and ear canal normal.  Nose: Nose normal.  Mouth/Throat: Uvula is midline, oropharynx is clear and moist and mucous membranes are normal. No oropharyngeal exudate, posterior oropharyngeal edema or posterior oropharyngeal erythema.  Eyes: Conjunctivae and EOM are normal. Pupils are equal, round, and reactive to light.  No scleral icterus.  Neck: Normal range of motion. Neck supple.  Cardiovascular: Normal rate, regular rhythm, normal heart sounds and intact distal pulses.   No murmur heard. Pulses:      Radial pulses are 2+ on the right side, and 2+ on the left side.  Pulmonary/Chest: Effort normal and breath sounds normal. No respiratory distress. He has no wheezes. He has no rales.   Abdominal: Soft. Bowel sounds are normal. He exhibits no distension and no mass. There is no tenderness. There is no rebound and no guarding.  Musculoskeletal: Normal range of motion. He exhibits no edema.  Lymphadenopathy:    He has no cervical adenopathy.  Neurological: He is alert and oriented to person, place, and time.  CN grossly intact, station and gait intact  Skin: Skin is warm and dry. No rash noted.  Psychiatric: He has a normal mood and affect. His behavior is normal. Judgment and thought content normal.  Nursing note and vitals reviewed.       Assessment & Plan:   Problem List Items Addressed This Visit    Health maintenance examination - Primary    Preventative protocols reviewed and updated unless pt declined. Discussed healthy diet and lifestyle.       History of stroke    What sounds like cryptogenic stroke vs ?PFO related. He saw neurology Dr Leonie Man - will request latest office note from circa 2005.  rec start aspirin 7m EC daily.       HLD (hyperlipidemia)    Update FLP when returns fasting.       Relevant Medications   aspirin EC 81 MG tablet   Other Relevant Orders   Lipid panel   Comprehensive metabolic panel   TSH   PFO (patent foramen ovale)   Relevant Medications   aspirin EC 81 MG tablet    Other Visit Diagnoses    Special screening for malignant neoplasm of prostate       Relevant Orders   PSA   Need for Tdap vaccination       Relevant Orders   Tdap vaccine greater than or equal to 7yo IM (Completed)       Follow up plan: Return in about 1 year (around 08/04/2017) for annual exam, prior fasting for blood work.  JRia Bush MD

## 2016-08-04 NOTE — Assessment & Plan Note (Addendum)
What sounds like cryptogenic stroke vs ?PFO related. He saw neurology Dr Leonie Man - will request latest office note from circa 2005.  rec start aspirin 46m EC daily.

## 2016-08-04 NOTE — Assessment & Plan Note (Signed)
Preventative protocols reviewed and updated unless pt declined. Discussed healthy diet and lifestyle.  

## 2016-08-04 NOTE — Assessment & Plan Note (Signed)
Update FLP when returns fasting.

## 2016-08-05 ENCOUNTER — Other Ambulatory Visit (INDEPENDENT_AMBULATORY_CARE_PROVIDER_SITE_OTHER): Payer: BC Managed Care – PPO

## 2016-08-05 DIAGNOSIS — E785 Hyperlipidemia, unspecified: Secondary | ICD-10-CM

## 2016-08-05 DIAGNOSIS — Z125 Encounter for screening for malignant neoplasm of prostate: Secondary | ICD-10-CM | POA: Diagnosis not present

## 2016-08-05 LAB — COMPREHENSIVE METABOLIC PANEL
ALT: 26 U/L (ref 0–53)
AST: 19 U/L (ref 0–37)
Albumin: 4.5 g/dL (ref 3.5–5.2)
Alkaline Phosphatase: 55 U/L (ref 39–117)
BUN: 15 mg/dL (ref 6–23)
CO2: 33 mEq/L — ABNORMAL HIGH (ref 19–32)
Calcium: 9.4 mg/dL (ref 8.4–10.5)
Chloride: 103 mEq/L (ref 96–112)
Creatinine, Ser: 1.2 mg/dL (ref 0.40–1.50)
GFR: 69.42 mL/min (ref >60.00)
Glucose, Bld: 94 mg/dL (ref 70–99)
Potassium: 4.4 mEq/L (ref 3.5–5.1)
Sodium: 139 mEq/L (ref 135–145)
Total Bilirubin: 0.7 mg/dL (ref 0.2–1.2)
Total Protein: 7.1 g/dL (ref 6.0–8.3)

## 2016-08-05 LAB — LIPID PANEL
Cholesterol: 166 mg/dL (ref 0–200)
HDL: 43.3 mg/dL (ref >39.00)
LDL Cholesterol: 105 mg/dL — ABNORMAL HIGH (ref 0–99)
NonHDL: 122.79
Total CHOL/HDL Ratio: 4
Triglycerides: 87 mg/dL (ref 0.0–149.0)
VLDL: 17.4 mg/dL (ref 0.0–40.0)

## 2016-08-05 LAB — PSA: PSA: 0.72 ng/mL (ref 0.10–4.00)

## 2016-08-05 LAB — TSH: TSH: 5.41 u[IU]/mL — ABNORMAL HIGH (ref 0.35–4.50)

## 2016-08-08 ENCOUNTER — Other Ambulatory Visit: Payer: Self-pay | Admitting: Family Medicine

## 2016-08-08 DIAGNOSIS — R7989 Other specified abnormal findings of blood chemistry: Secondary | ICD-10-CM

## 2016-08-08 DIAGNOSIS — E039 Hypothyroidism, unspecified: Secondary | ICD-10-CM | POA: Insufficient documentation

## 2016-10-07 ENCOUNTER — Other Ambulatory Visit (INDEPENDENT_AMBULATORY_CARE_PROVIDER_SITE_OTHER): Payer: BC Managed Care – PPO

## 2016-10-07 ENCOUNTER — Encounter (INDEPENDENT_AMBULATORY_CARE_PROVIDER_SITE_OTHER): Payer: Self-pay

## 2016-10-07 DIAGNOSIS — R946 Abnormal results of thyroid function studies: Secondary | ICD-10-CM | POA: Diagnosis not present

## 2016-10-07 DIAGNOSIS — R7989 Other specified abnormal findings of blood chemistry: Secondary | ICD-10-CM

## 2016-10-07 LAB — TSH: TSH: 6.91 u[IU]/mL — ABNORMAL HIGH (ref 0.35–4.50)

## 2016-10-07 LAB — T4, FREE: Free T4: 0.71 ng/dL (ref 0.60–1.60)

## 2016-10-08 LAB — T3: T3, Total: 107 ng/dL (ref 76–181)

## 2016-10-12 ENCOUNTER — Encounter: Payer: Self-pay | Admitting: Family Medicine

## 2016-10-14 ENCOUNTER — Telehealth: Payer: Self-pay

## 2016-10-14 DIAGNOSIS — E039 Hypothyroidism, unspecified: Secondary | ICD-10-CM

## 2016-10-14 DIAGNOSIS — E038 Other specified hypothyroidism: Secondary | ICD-10-CM

## 2016-10-14 MED ORDER — LEVOTHYROXINE SODIUM 50 MCG PO TABS: 50.0000 ug | ORAL_TABLET | Freq: Every day | ORAL | 6 refills | Status: DC

## 2016-10-14 NOTE — Telephone Encounter (Signed)
Pt wanted Dr Darnell Level to know that pt has had some of symptoms for low thyroid for years such as flaky skin, coarse hair, fatigue (pt wants to take a nap any time he sits down) after sleeping all night still feels sleepy.. Pt wanted to know if Dr Darnell Level wanted to start him on low dose thyroid med and then reck lab at 02/17/17 f/u appt. Pt request cb after Dr Darnell Level reviews. [facility] [address]

## 2016-10-14 NOTE — Telephone Encounter (Signed)
Noted. Let's start low dose levothyroxine 57mg daily sent to pharmacy - take on empty stomach with glass of water first thing in am if able.  Will recheck levels at 02/2017 f/u.

## 2016-10-15 NOTE — Addendum Note (Signed)
Addended by: Ria Bush on: 10/15/2016 01:46 PM   Modules accepted: Orders

## 2016-10-15 NOTE — Telephone Encounter (Signed)
Labs ordered.

## 2016-10-15 NOTE — Telephone Encounter (Signed)
I spoke to patient. I advised him of recommendations and to have labs drawn the week before 09.2018 f/u appt. He voiced understanding and thanks.

## 2017-02-09 ENCOUNTER — Other Ambulatory Visit (INDEPENDENT_AMBULATORY_CARE_PROVIDER_SITE_OTHER): Payer: BC Managed Care – PPO

## 2017-02-09 DIAGNOSIS — E039 Hypothyroidism, unspecified: Secondary | ICD-10-CM | POA: Diagnosis not present

## 2017-02-09 DIAGNOSIS — E038 Other specified hypothyroidism: Secondary | ICD-10-CM

## 2017-02-09 LAB — TSH: TSH: 4.73 u[IU]/mL — ABNORMAL HIGH (ref 0.35–4.50)

## 2017-02-09 LAB — T4, FREE: Free T4: 0.9 ng/dL (ref 0.60–1.60)

## 2017-02-17 ENCOUNTER — Ambulatory Visit: Payer: BC Managed Care – PPO | Admitting: Family Medicine

## 2017-02-19 ENCOUNTER — Ambulatory Visit: Payer: BC Managed Care – PPO | Admitting: Family Medicine

## 2017-02-26 ENCOUNTER — Ambulatory Visit (INDEPENDENT_AMBULATORY_CARE_PROVIDER_SITE_OTHER): Payer: BC Managed Care – PPO | Admitting: Family Medicine

## 2017-02-26 ENCOUNTER — Encounter: Payer: Self-pay | Admitting: Family Medicine

## 2017-02-26 VITALS — BP 120/80 | HR 68 | Temp 97.6°F | Wt 233.0 lb

## 2017-02-26 DIAGNOSIS — J302 Other seasonal allergic rhinitis: Secondary | ICD-10-CM | POA: Insufficient documentation

## 2017-02-26 DIAGNOSIS — E039 Hypothyroidism, unspecified: Secondary | ICD-10-CM | POA: Diagnosis not present

## 2017-02-26 MED ORDER — TRIAMCINOLONE ACETONIDE 55 MCG/ACT NA AERO: 2.0000 | INHALATION_SPRAY | Freq: Every day | NASAL | 11 refills | Status: AC

## 2017-02-26 MED ORDER — LEVOTHYROXINE SODIUM 88 MCG PO TABS: 88.0000 ug | ORAL_TABLET | Freq: Every day | ORAL | 6 refills | Status: DC

## 2017-02-26 NOTE — Assessment & Plan Note (Signed)
TSH remains elevated. Will continue titrating levothyroxine to 30mg daily. Recheck levels in 3 months. Reviewed correct administration of levothyroxine.

## 2017-02-26 NOTE — Assessment & Plan Note (Addendum)
Discussed allergy medication. Suggested allegra or xyzal, continue nasacort.

## 2017-02-26 NOTE — Patient Instructions (Addendum)
For allergies - continue nasacort, try allegra or xyzal.  Increase levothyroxine to 8mg daily (1.5 tablets) for 2 weeks then increase to 820m daily - new dose at pharmacy.  Return in 3 months for lab visit only to recheck thyroid levels.

## 2017-02-26 NOTE — Progress Notes (Signed)
BP 120/80 (BP Location: Left Arm, Patient Position: Sitting, Cuff Size: Large)   Pulse 68   Temp 97.6 F (36.4 C) (Oral)   Wt 233 lb (105.7 kg)   SpO2 97%   BMI 29.12 kg/m    CC: 4 m f/u visit Subjective:    Patient ID: Andrew [name], male    DOB: May 27, 1971, 46 y.o.   MRN: [id number]  HPI: TRE SANKER is a 46 y.o. male presenting on 02/26/2017 for 4 month follow-up   Hypothyroidism - found to have elevated TSH with free T4 0.9. He did endorse flaky skin, coarse hair, fatigue. We did start levothyroxine 75mg daily. Here for f/u. He hasn't noticed much change since starting levothyroxine. Denies heat or cold intolerance, diarrhea, palpitations, dyspnea or unexpected weight changes. Mild constipation.   Ongoing trouble with daytime fatigue despite averaging 8-10 hours of sleep. Doesn't wake up at night. He thinks he snores. No witnessed apnea. No sleepiness with driving.   Ongoing allergy seasonal allergies - found zyrtec too sedating. Asks about alternative. claritin works but not strong enough. He also takes nasacort daily. Flonase wasn't as effective.   I never received records from Dr SLeonie Man Will request again today.   Relevant past medical, surgical, family and social history reviewed and updated as indicated. Interim medical history since our last visit reviewed. Allergies and medications reviewed and updated. Outpatient Medications Prior to Visit  Medication Sig Dispense Refill  . aspirin EC 81 MG tablet Take 81 mg by mouth daily.    .Marland Kitchenlevothyroxine (SYNTHROID, LEVOTHROID) 50 MCG tablet Take 1 tablet (50 mcg total) by mouth daily. 30 tablet 6   No facility-administered medications prior to visit.      Per HPI unless specifically indicated in ROS section below Review of Systems     Objective:    BP 120/80 (BP Location: Left Arm, Patient Position: Sitting, Cuff Size: Large)   Pulse 68   Temp 97.6 F (36.4 C) (Oral)   Wt 233 lb (105.7 kg)   SpO2 97%   BMI 29.12  kg/m   Wt Readings from Last 3 Encounters:  02/26/17 233 lb (105.7 kg)  08/04/16 226 lb 1.9 oz (102.6 kg)  01/23/08 213 lb 3.2 oz (96.7 kg)    Physical Exam  Constitutional: He appears well-developed and well-nourished. No distress.  HENT:  Mouth/Throat: Oropharynx is clear and moist. No oropharyngeal exudate.  Neck: Normal range of motion. Neck supple. No thyromegaly present.  Cardiovascular: Normal rate, regular rhythm, normal heart sounds and intact distal pulses.   No murmur heard. Pulmonary/Chest: Effort normal and breath sounds normal. No respiratory distress. He has no wheezes. He has no rales.  Musculoskeletal: He exhibits no edema.  Lymphadenopathy:    He has no cervical adenopathy.  Skin: Skin is warm and dry. No rash noted.  Psychiatric: He has a normal mood and affect.  Nursing note and vitals reviewed.  Results for orders placed or performed in visit on 02/09/17  TSH  Result Value Ref Range   TSH 4.73 (H) 0.35 - 4.50 uIU/mL  T4, free  Result Value Ref Range   Free T4 0.90 0.60 - 1.60 ng/dL      Assessment & Plan:   Problem List Items Addressed This Visit    Hypothyroidism (acquired) - Primary    TSH remains elevated. Will continue titrating levothyroxine to 870m daily. Recheck levels in 3 months. Reviewed correct administration of levothyroxine.       Relevant Medications  levothyroxine (SYNTHROID, LEVOTHROID) 88 MCG tablet   Other Relevant Orders   TSH   Seasonal allergic rhinitis    Discussed allergy medication. Suggested allegra or xyzal, continue nasacort.           Follow up plan: No Follow-up on file.  Ria Bush, MD

## 2017-05-06 ENCOUNTER — Other Ambulatory Visit: Payer: Self-pay | Admitting: Family Medicine

## 2017-05-28 ENCOUNTER — Other Ambulatory Visit (INDEPENDENT_AMBULATORY_CARE_PROVIDER_SITE_OTHER): Payer: BC Managed Care – PPO

## 2017-05-28 DIAGNOSIS — E039 Hypothyroidism, unspecified: Secondary | ICD-10-CM

## 2017-05-28 LAB — TSH: TSH: 2.97 u[IU]/mL (ref 0.35–4.50)

## 2017-06-24 ENCOUNTER — Other Ambulatory Visit: Payer: Self-pay | Admitting: Family Medicine

## 2017-08-01 ENCOUNTER — Other Ambulatory Visit: Payer: Self-pay | Admitting: Family Medicine

## 2017-08-01 DIAGNOSIS — Z8042 Family history of malignant neoplasm of prostate: Secondary | ICD-10-CM

## 2017-08-01 DIAGNOSIS — E039 Hypothyroidism, unspecified: Secondary | ICD-10-CM

## 2017-08-01 DIAGNOSIS — E785 Hyperlipidemia, unspecified: Secondary | ICD-10-CM

## 2017-08-04 ENCOUNTER — Other Ambulatory Visit (INDEPENDENT_AMBULATORY_CARE_PROVIDER_SITE_OTHER): Payer: BC Managed Care – PPO

## 2017-08-04 DIAGNOSIS — E039 Hypothyroidism, unspecified: Secondary | ICD-10-CM | POA: Diagnosis not present

## 2017-08-04 DIAGNOSIS — E785 Hyperlipidemia, unspecified: Secondary | ICD-10-CM | POA: Diagnosis not present

## 2017-08-04 DIAGNOSIS — Z8042 Family history of malignant neoplasm of prostate: Secondary | ICD-10-CM | POA: Diagnosis not present

## 2017-08-04 LAB — COMPREHENSIVE METABOLIC PANEL
ALBUMIN: 4.3 g/dL (ref 3.5–5.2)
ALT: 23 U/L (ref 0–53)
AST: 17 U/L (ref 0–37)
Alkaline Phosphatase: 52 U/L (ref 39–117)
BUN: 16 mg/dL (ref 6–23)
CALCIUM: 9.5 mg/dL (ref 8.4–10.5)
CHLORIDE: 101 meq/L (ref 96–112)
CO2: 32 meq/L (ref 19–32)
Creatinine, Ser: 1.16 mg/dL (ref 0.40–1.50)
GFR: 71.87 mL/min (ref >60.00)
Glucose, Bld: 96 mg/dL (ref 70–99)
POTASSIUM: 4.5 meq/L (ref 3.5–5.1)
Sodium: 139 mEq/L (ref 135–145)
Total Bilirubin: 0.5 mg/dL (ref 0.2–1.2)
Total Protein: 7.3 g/dL (ref 6.0–8.3)

## 2017-08-04 LAB — LIPID PANEL
CHOL/HDL RATIO: 4
CHOLESTEROL: 165 mg/dL (ref 0–200)
HDL: 42.2 mg/dL (ref >39.00)
LDL CALC: 100 mg/dL — AB (ref 0–99)
NonHDL: 122.97
TRIGLYCERIDES: 116 mg/dL (ref 0.0–149.0)
VLDL: 23.2 mg/dL (ref 0.0–40.0)

## 2017-08-04 LAB — PSA: PSA: 0.8 ng/mL (ref 0.10–4.00)

## 2017-08-04 LAB — TSH: TSH: 6.23 u[IU]/mL — ABNORMAL HIGH (ref 0.35–4.50)

## 2017-08-04 LAB — T4, FREE: FREE T4: 0.77 ng/dL (ref 0.60–1.60)

## 2017-08-06 ENCOUNTER — Encounter: Payer: Self-pay | Admitting: Family Medicine

## 2017-08-06 ENCOUNTER — Ambulatory Visit (INDEPENDENT_AMBULATORY_CARE_PROVIDER_SITE_OTHER): Payer: BC Managed Care – PPO | Admitting: Family Medicine

## 2017-08-06 VITALS — BP 120/84 | HR 73 | Temp 97.9°F | Ht 75.0 in | Wt 235.0 lb

## 2017-08-06 DIAGNOSIS — Z0000 Encounter for general adult medical examination without abnormal findings: Secondary | ICD-10-CM

## 2017-08-06 DIAGNOSIS — E039 Hypothyroidism, unspecified: Secondary | ICD-10-CM

## 2017-08-06 DIAGNOSIS — E785 Hyperlipidemia, unspecified: Secondary | ICD-10-CM

## 2017-08-06 DIAGNOSIS — Z8673 Personal history of transient ischemic attack (TIA), and cerebral infarction without residual deficits: Secondary | ICD-10-CM

## 2017-08-06 MED ORDER — LEVOTHYROXINE SODIUM 100 MCG PO TABS: 100.0000 ug | ORAL_TABLET | Freq: Every day | ORAL | 6 refills | Status: DC

## 2017-08-06 NOTE — Patient Instructions (Signed)
You are doing well today.  Increase levothyroxine to 121mg daily - new dose at pharmacy. Return in 6 weeks for lab visit only to recheck levels.  Return as needed or in 1 year for next physical.  Good to see you today.   Health Maintenance, Male A healthy lifestyle and preventive care is important for your health and wellness. Ask your health care provider about what schedule of regular examinations is right for you. What should I know about weight and diet? Eat a Healthy Diet  Eat plenty of vegetables, fruits, whole grains, low-fat dairy products, and lean protein.  Do not eat a lot of foods high in solid fats, added sugars, or salt.  Maintain a Healthy Weight Regular exercise can help you achieve or maintain a healthy weight. You should:  Do at least 150 minutes of exercise each week. The exercise should increase your heart rate and make you sweat (moderate-intensity exercise).  Do strength-training exercises at least twice a week.  Watch Your Levels of Cholesterol and Blood Lipids  Have your blood tested for lipids and cholesterol every 5 years starting at 47years of age. If you are at high risk for heart disease, you should start having your blood tested when you are 47years old. You may need to have your cholesterol levels checked more often if: ? Your lipid or cholesterol levels are high. ? You are older than 47years of age. ? You are at high risk for heart disease.  What should I know about cancer screening? Many types of cancers can be detected early and may often be prevented. Lung Cancer  You should be screened every year for lung cancer if: ? You are a current smoker who has smoked for at least 30 years. ? You are a former smoker who has quit within the past 15 years.  Talk to your health care provider about your screening options, when you should start screening, and how often you should be screened.  Colorectal Cancer  Routine colorectal cancer screening  usually begins at 47years of age and should be repeated every 5-10 years until you are 47years old. You may need to be screened more often if early forms of precancerous polyps or small growths are found. Your health care provider may recommend screening at an earlier age if you have risk factors for colon cancer.  Your health care provider may recommend using home test kits to check for hidden blood in the stool.  A small camera at the end of a tube can be used to examine your colon (sigmoidoscopy or colonoscopy). This checks for the earliest forms of colorectal cancer.  Prostate and Testicular Cancer  Depending on your age and overall health, your health care provider may do certain tests to screen for prostate and testicular cancer.  Talk to your health care provider about any symptoms or concerns you have about testicular or prostate cancer.  Skin Cancer  Check your skin from head to toe regularly.  Tell your health care provider about any new moles or changes in moles, especially if: ? There is a change in a mole's size, shape, or color. ? You have a mole that is larger than a pencil eraser.  Always use sunscreen. Apply sunscreen liberally and repeat throughout the day.  Protect yourself by wearing long sleeves, pants, a wide-brimmed hat, and sunglasses when outside.  What should I know about heart disease, diabetes, and high blood pressure?  If you are 18-39  years of age, have your blood pressure checked every 3-5 years. If you are 3 years of age or older, have your blood pressure checked every year. You should have your blood pressure measured twice-once when you are at a hospital or clinic, and once when you are not at a hospital or clinic. Record the average of the two measurements. To check your blood pressure when you are not at a hospital or clinic, you can use: ? An automated blood pressure machine at a pharmacy. ? A home blood pressure monitor.  Talk to your health care  provider about your target blood pressure.  If you are between 97-103 years old, ask your health care provider if you should take aspirin to prevent heart disease.  Have regular diabetes screenings by checking your fasting blood sugar level. ? If you are at a normal weight and have a low risk for diabetes, have this test once every three years after the age of 74. ? If you are overweight and have a high risk for diabetes, consider being tested at a younger age or more often.  A one-time screening for abdominal aortic aneurysm (AAA) by ultrasound is recommended for men aged 35-75 years who are current or former smokers. What should I know about preventing infection? Hepatitis B If you have a higher risk for hepatitis B, you should be screened for this virus. Talk with your health care provider to find out if you are at risk for hepatitis B infection. Hepatitis C Blood testing is recommended for:  Everyone born from [birth year] through 1965.  Anyone with known risk factors for hepatitis C.  Sexually Transmitted Diseases (STDs)  You should be screened each year for STDs including gonorrhea and chlamydia if: ? You are sexually active and are younger than 48 years of age. ? You are older than 47 years of age and your health care provider tells you that you are at risk for this type of infection. ? Your sexual activity has changed since you were last screened and you are at an increased risk for chlamydia or gonorrhea. Ask your health care provider if you are at risk.  Talk with your health care provider about whether you are at high risk of being infected with HIV. Your health care provider may recommend a prescription medicine to help prevent HIV infection.  What else can I do?  Schedule regular health, dental, and eye exams.  Stay current with your vaccines (immunizations).  Do not use any tobacco products, such as cigarettes, chewing tobacco, and e-cigarettes. If you need help quitting, ask  your health care provider.  Limit alcohol intake to no more than 2 drinks per day. One drink equals 12 ounces of beer, 5 ounces of wine, or 1 ounces of hard liquor.  Do not use street drugs.  Do not share needles.  Ask your health care provider for help if you need support or information about quitting drugs.  Tell your health care provider if you often feel depressed.  Tell your health care provider if you have ever been abused or do not feel safe at home. This information is not intended to replace advice given to you by your health care provider. Make sure you discuss any questions you have with your health care provider. Document Released: 11/28/2007 Document Revised: 01/29/2016 Document Reviewed: 03/05/2015 Elsevier Interactive Patient Education  [employer].

## 2017-08-06 NOTE — Assessment & Plan Note (Addendum)
Continues aspirin 2m daily. Never received neurology records. Have requested again (3rd time)

## 2017-08-06 NOTE — Assessment & Plan Note (Addendum)
Chronic, minimal off medications. The 10-year ASCVD risk score Mikey Bussing DC Brooke Bonito., et al., 2013) is: 1.8%   Values used to calculate the score:     Age: 47 years     Sex: Male     Is Non-Hispanic African American: No     Diabetic: No     Tobacco smoker: No     Systolic Blood Pressure: 193 mmHg     Is BP treated: No     HDL Cholesterol: 42.2 mg/dL     Total Cholesterol: 165 mg/dL

## 2017-08-06 NOTE — Progress Notes (Signed)
BP 120/84 (BP Location: Left Arm, Patient Position: Sitting, Cuff Size: Normal)   Pulse 73   Temp 97.9 F (36.6 C) (Oral)   Ht 6' 3"  (1.905 m)   Wt 235 lb (106.6 kg)   SpO2 97%   BMI 29.37 kg/m    CC: CPE Subjective:    Patient ID: Andrew [name], male    DOB: 1971-04-19, 47 y.o.   MRN: [id number]  HPI: Andrew [name] is a 47 y.o. 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 Has seen neurology.  Hypothyroidism - last visit we started levothyroxine 43mg daily.   Preventative: Prostate cancer screening - father with h/o this. Will start screen today.  Flu shot - declines Tdap 2018 Seat belt use discussed Sunscreen use discussed. He is outside a lot. No changing moles on skin. 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 Interim medical history since our last visit reviewed. Allergies and medications reviewed and updated. Outpatient Medications Prior to Visit  Medication Sig Dispense Refill  . aspirin EC 81 MG tablet Take 81 mg by mouth daily.    . Levocetirizine Dihydrochloride (XYZAL PO) Take by mouth. Takes seasonally    . triamcinolone (NASACORT) 55 MCG/ACT AERO nasal inhaler Place 2 sprays into the nose daily. 1 Inhaler 11  . levothyroxine (SYNTHROID, LEVOTHROID) 88 MCG tablet TAKE 1 TABLET BY MOUTH EVERY DAY 30 tablet 5   No facility-administered medications prior to visit.      Per HPI unless specifically indicated in ROS section below Review of Systems  Constitutional: Negative for activity change, appetite change, chills, fatigue, fever and unexpected weight change.  HENT: Negative for hearing loss.   Eyes: Negative  for visual disturbance.  Respiratory: Negative for cough, chest tightness, shortness of breath and wheezing.   Cardiovascular: Negative for chest pain, palpitations and leg swelling.  Gastrointestinal: Positive for constipation (mild). Negative for abdominal distention, abdominal pain, blood in stool, diarrhea, nausea and vomiting.  Endocrine: Negative for cold intolerance and heat intolerance.  Genitourinary: Negative for difficulty urinating and hematuria.  Musculoskeletal: Negative for arthralgias, myalgias and neck pain.  Skin: Negative for rash.  Neurological: Negative for dizziness, seizures, syncope and headaches.  Hematological: Negative for adenopathy. Does not bruise/bleed easily.  Psychiatric/Behavioral: Negative for dysphoric mood. The patient is not nervous/anxious.        Objective:    BP 120/84 (BP Location: Left Arm, Patient Position: Sitting, Cuff Size: Normal)   Pulse 73   Temp 97.9 F (36.6 C) (Oral)   Ht 6' 3"  (1.905 m)   Wt 235 lb (106.6 kg)   SpO2 97%   BMI 29.37 kg/m   Wt Readings from Last 3 Encounters:  08/06/17 235 lb (106.6 kg)  02/26/17 233 lb (105.7 kg)  08/04/16 226 lb 1.9 oz (102.6 kg)    Physical Exam  Constitutional: He is oriented to person, place, and time. He appears well-developed and well-nourished. No distress.  HENT:  Head: Normocephalic and atraumatic.  Right Ear: Hearing, tympanic membrane, external ear and ear canal normal.  Left Ear: Hearing, tympanic membrane, external ear and ear canal normal.  Nose: Nose normal.  Mouth/Throat: Uvula is midline,  oropharynx is clear and moist and mucous membranes are normal. No oropharyngeal exudate, posterior oropharyngeal edema or posterior oropharyngeal erythema.  Eyes: Conjunctivae and EOM are normal. Pupils are equal, round, and reactive to light. No scleral icterus.  Neck: Normal range of motion. Neck supple. No thyromegaly present.  Cardiovascular: Normal rate, regular rhythm, normal heart  sounds and intact distal pulses.  No murmur heard. Pulses:      Radial pulses are 2+ on the right side, and 2+ on the left side.  Pulmonary/Chest: Effort normal and breath sounds normal. No respiratory distress. He has no wheezes. He has no rales.  Abdominal: Soft. Bowel sounds are normal. He exhibits no distension and no mass. There is no tenderness. There is no rebound and no guarding.  Genitourinary: Rectum normal and prostate normal. Rectal exam shows no external hemorrhoid, no internal hemorrhoid, no fissure, no mass, no tenderness and anal tone normal. Prostate is not enlarged (20gm) and not tender.  Musculoskeletal: Normal range of motion. He exhibits no edema.  Lymphadenopathy:    He has no cervical adenopathy.  Neurological: He is alert and oriented to person, place, and time.  CN grossly intact, station and gait intact  Skin: Skin is warm and dry. No rash noted.  Psychiatric: He has a normal mood and affect. His behavior is normal. Judgment and thought content normal.  Nursing note and vitals reviewed.  Results for orders placed or performed in visit on 08/04/17  T4, free  Result Value Ref Range   Free T4 0.77 0.60 - 1.60 ng/dL  PSA  Result Value Ref Range   PSA 0.80 0.10 - 4.00 ng/mL  TSH  Result Value Ref Range   TSH 6.23 (H) 0.35 - 4.50 uIU/mL  Comprehensive metabolic panel  Result Value Ref Range   Sodium 139 135 - 145 mEq/L   Potassium 4.5 3.5 - 5.1 mEq/L   Chloride 101 96 - 112 mEq/L   CO2 32 19 - 32 mEq/L   Glucose, Bld 96 70 - 99 mg/dL   BUN 16 6 - 23 mg/dL   Creatinine, Ser 1.16 0.40 - 1.50 mg/dL   Total Bilirubin 0.5 0.2 - 1.2 mg/dL   Alkaline Phosphatase 52 39 - 117 U/L   AST 17 0 - 37 U/L   ALT 23 0 - 53 U/L   Total Protein 7.3 6.0 - 8.3 g/dL   Albumin 4.3 3.5 - 5.2 g/dL   Calcium 9.5 8.4 - 10.5 mg/dL   GFR 71.87 >60.00 mL/min  Lipid panel  Result Value Ref Range   Cholesterol 165 0 - 200 mg/dL   Triglycerides 116.0 0.0 - 149.0 mg/dL   HDL 42.20  >39.00 mg/dL   VLDL 23.2 0.0 - 40.0 mg/dL   LDL Cholesterol 100 (H) 0 - 99 mg/dL   Total CHOL/HDL Ratio 4    NonHDL 122.97       Assessment & Plan:   Problem List Items Addressed This Visit    Health maintenance examination - Primary    Preventative protocols reviewed and updated unless pt declined. Discussed healthy diet and lifestyle.       History of stroke    Continues aspirin 31m daily. Never received neurology records. Have requested again (3rd time)      HLD (hyperlipidemia)    Chronic, minimal off medications. The 10-year ASCVD risk score (Mikey BussingDC Jr., et al., 2013) is: 1.8%   Values used to calculate the score:     Age: [age]years  Sex: Male     Is Non-Hispanic African American: No     Diabetic: No     Tobacco smoker: No     Systolic Blood Pressure: 371 mmHg     Is BP treated: No     HDL Cholesterol: 42.2 mg/dL     Total Cholesterol: 165 mg/dL       Hypothyroidism (acquired)    Chronic, TSH elevated - increase levothyroxine to 162mg daily. Reviewed correct administration       Relevant Medications   levothyroxine (SYNTHROID, LEVOTHROID) 100 MCG tablet   Other Relevant Orders   TSH   T3   T4, free       Meds ordered this encounter  Medications  . levothyroxine (SYNTHROID, LEVOTHROID) 100 MCG tablet    Sig: Take 1 tablet (100 mcg total) by mouth daily.    Dispense:  30 tablet    Refill:  6   Orders Placed This Encounter  Procedures  . TSH    Standing Status:   Future    Standing Expiration Date:   08/06/2018  . T3    Standing Status:   Future    Standing Expiration Date:   08/06/2018  . T4, free    Standing Status:   Future    Standing Expiration Date:   08/06/2018    Follow up plan: Return in about 1 year (around 08/06/2018) for annual exam, prior fasting for blood work.  JRia Bush MD

## 2017-08-06 NOTE — Assessment & Plan Note (Signed)
Chronic, TSH elevated - increase levothyroxine to 133mg daily. Reviewed correct administration

## 2017-08-06 NOTE — Assessment & Plan Note (Signed)
Preventative protocols reviewed and updated unless pt declined. Discussed healthy diet and lifestyle.  

## 2017-09-13 ENCOUNTER — Other Ambulatory Visit (INDEPENDENT_AMBULATORY_CARE_PROVIDER_SITE_OTHER): Payer: BC Managed Care – PPO

## 2017-09-13 DIAGNOSIS — E039 Hypothyroidism, unspecified: Secondary | ICD-10-CM | POA: Diagnosis not present

## 2017-09-13 LAB — TSH: TSH: 4.43 u[IU]/mL (ref 0.35–4.50)

## 2017-09-13 LAB — T4, FREE: Free T4: 0.8 ng/dL (ref 0.60–1.60)

## 2017-09-14 LAB — T3: T3 TOTAL: 104 ng/dL (ref 76–181)

## 2018-02-02 ENCOUNTER — Other Ambulatory Visit: Payer: Self-pay | Admitting: Family Medicine

## 2018-08-07 ENCOUNTER — Other Ambulatory Visit: Payer: Self-pay | Admitting: Family Medicine

## 2018-08-07 DIAGNOSIS — Z8042 Family history of malignant neoplasm of prostate: Secondary | ICD-10-CM

## 2018-08-07 DIAGNOSIS — E039 Hypothyroidism, unspecified: Secondary | ICD-10-CM

## 2018-08-07 DIAGNOSIS — E785 Hyperlipidemia, unspecified: Secondary | ICD-10-CM

## 2018-08-08 ENCOUNTER — Other Ambulatory Visit (INDEPENDENT_AMBULATORY_CARE_PROVIDER_SITE_OTHER): Payer: BC Managed Care – PPO

## 2018-08-08 DIAGNOSIS — E785 Hyperlipidemia, unspecified: Secondary | ICD-10-CM

## 2018-08-08 DIAGNOSIS — Z8042 Family history of malignant neoplasm of prostate: Secondary | ICD-10-CM | POA: Diagnosis not present

## 2018-08-08 DIAGNOSIS — E039 Hypothyroidism, unspecified: Secondary | ICD-10-CM | POA: Diagnosis not present

## 2018-08-08 LAB — LIPID PANEL
Cholesterol: 169 mg/dL (ref 0–200)
HDL: 48.4 mg/dL (ref >39.00)
LDL CALC: 105 mg/dL — AB (ref 0–99)
NONHDL: 121.08
Total CHOL/HDL Ratio: 4
Triglycerides: 79 mg/dL (ref 0.0–149.0)
VLDL: 15.8 mg/dL (ref 0.0–40.0)

## 2018-08-08 LAB — BASIC METABOLIC PANEL
BUN: 18 mg/dL (ref 6–23)
CO2: 30 mEq/L (ref 19–32)
CREATININE: 1.23 mg/dL (ref 0.40–1.50)
Calcium: 9 mg/dL (ref 8.4–10.5)
Chloride: 104 mEq/L (ref 96–112)
GFR: 62.92 mL/min (ref >60.00)
Glucose, Bld: 97 mg/dL (ref 70–99)
Potassium: 4.8 mEq/L (ref 3.5–5.1)
Sodium: 140 mEq/L (ref 135–145)

## 2018-08-08 LAB — PSA: PSA: 0.73 ng/mL (ref 0.10–4.00)

## 2018-08-08 LAB — T4, FREE: FREE T4: 0.82 ng/dL (ref 0.60–1.60)

## 2018-08-08 LAB — TSH: TSH: 2.77 u[IU]/mL (ref 0.35–4.50)

## 2018-08-11 ENCOUNTER — Encounter: Payer: Self-pay | Admitting: Family Medicine

## 2018-08-11 NOTE — Progress Notes (Signed)
BP 120/84 (BP Location: Left Arm, Patient Position: Sitting, Cuff Size: Large)   Pulse 91   Temp 98.4 F (36.9 C) (Oral)   Ht 6' 2.5" (1.892 m)   Wt 238 lb 9 oz (108.2 kg)   SpO2 98%   BMI 30.22 kg/m    CC: CPE Subjective:    Patient ID: Andrew [name], male    DOB: July 07, 1970, 48 y.o.   MRN: [id number]  HPI: [name] is a 48 y.o. male presenting on 08/12/2018 for Annual Exam   H/o Rlateral midbrain (mesencephalic lesion)CVA by MRIage 48yo ?PFO related. Has seen neurology. No records available Leonie Man).   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 No h/o respiratory trouble  Preventative: Prostate cancer screening - father with h/o this. DRE/PSA reassuring. Discussed spacing out DRE.  Flu shot - declines Tdap 2018 Seat belt use discussed Sunscreen use discussed. He is outside a lot. No changing moles on skin.  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 Interim medical history since our last visit reviewed. Allergies and medications reviewed and updated. Outpatient Medications Prior to Visit  Medication Sig Dispense Refill  . aspirin EC 81 MG tablet Take 81 mg by mouth daily.    . Levocetirizine Dihydrochloride (XYZAL PO) Take by mouth. Takes seasonally    . levothyroxine (SYNTHROID, LEVOTHROID) 100 MCG tablet TAKE 1 TABLET BY MOUTH EVERY DAY 90 tablet 2  . triamcinolone (NASACORT) 55 MCG/ACT AERO nasal inhaler Place 2 sprays into the nose daily. 1 Inhaler 11   No facility-administered medications prior to visit.      Per HPI unless specifically indicated in ROS section below Review of Systems  Constitutional: Negative for  activity change, appetite change, chills, fatigue, fever and unexpected weight change.  HENT: Negative for hearing loss.   Eyes: Negative for visual disturbance.  Respiratory: Negative for cough, chest tightness, shortness of breath and wheezing.   Cardiovascular: Negative for chest pain, palpitations and leg swelling.  Gastrointestinal: Negative for abdominal distention, abdominal pain, blood in stool, constipation, diarrhea, nausea and vomiting.  Genitourinary: Negative for difficulty urinating and hematuria.  Musculoskeletal: Negative for arthralgias, myalgias and neck pain.  Skin: Negative for rash.  Neurological: Negative for dizziness, seizures, syncope and headaches.  Hematological: Negative for adenopathy. Does not bruise/bleed easily.  Psychiatric/Behavioral: Negative for dysphoric mood. The patient is not nervous/anxious.    Objective:    BP 120/84 (BP Location: Left Arm, Patient Position: Sitting, Cuff Size: Large)   Pulse 91   Temp 98.4 F (36.9 C) (Oral)   Ht 6' 2.5" (1.892 m)   Wt 238 lb 9 oz (108.2 kg)   SpO2 98%   BMI 30.22 kg/m   Wt Readings from Last 3 Encounters:  08/12/18 238 lb 9 oz (108.2 kg)  08/06/17 235 lb (106.6 kg)  02/26/17 233 lb (105.7 kg)    Physical Exam Vitals signs and nursing note reviewed.  Constitutional:      General: He is not in acute distress.    Appearance: Normal appearance. He is well-developed.  HENT:     Head: Normocephalic and atraumatic.     Right Ear: Hearing, tympanic membrane, ear  canal and external ear normal.     Left Ear: Hearing, tympanic membrane, ear canal and external ear normal.     Nose: Nose normal. No congestion.     Mouth/Throat:     Mouth: Mucous membranes are moist.     Pharynx: Uvula midline. No oropharyngeal exudate or posterior oropharyngeal erythema.  Eyes:     General: No scleral icterus.    Conjunctiva/sclera: Conjunctivae normal.     Pupils: Pupils are equal, round, and reactive to light.  Neck:      Musculoskeletal: Normal range of motion and neck supple.     Vascular: No carotid bruit.  Cardiovascular:     Rate and Rhythm: Normal rate and regular rhythm.     Pulses: Normal pulses.          Radial pulses are 2+ on the right side and 2+ on the left side.     Heart sounds: Normal heart sounds. No murmur.  Pulmonary:     Effort: Pulmonary effort is normal. No respiratory distress.     Breath sounds: Normal breath sounds. No wheezing, rhonchi or rales.  Abdominal:     General: Bowel sounds are normal. There is no distension.     Palpations: Abdomen is soft. There is no mass.     Tenderness: There is no abdominal tenderness. There is no guarding or rebound.  Genitourinary:    Prostate: Normal. Not enlarged (20gm), not tender and no nodules present.     Rectum: Normal. No mass, tenderness, anal fissure, external hemorrhoid or internal hemorrhoid. Normal anal tone.  Musculoskeletal: Normal range of motion.  Lymphadenopathy:     Cervical: No cervical adenopathy.  Skin:    General: Skin is warm and dry.     Findings: No rash.  Neurological:     Mental Status: He is alert and oriented to person, place, and time.     Comments: CN grossly intact, station and gait intact  Psychiatric:        Mood and Affect: Mood normal.        Behavior: Behavior normal.        Thought Content: Thought content normal.        Judgment: Judgment normal.       Results for orders placed or performed in visit on 08/08/18  T4, free  Result Value Ref Range   Free T4 0.82 0.60 - 1.60 ng/dL  TSH  Result Value Ref Range   TSH 2.77 0.35 - 4.50 uIU/mL  PSA  Result Value Ref Range   PSA 0.73 0.10 - 4.00 ng/mL  Basic metabolic panel  Result Value Ref Range   Sodium 140 135 - 145 mEq/L   Potassium 4.8 3.5 - 5.1 mEq/L   Chloride 104 96 - 112 mEq/L   CO2 30 19 - 32 mEq/L   Glucose, Bld 97 70 - 99 mg/dL   BUN 18 6 - 23 mg/dL   Creatinine, Ser 1.23 0.40 - 1.50 mg/dL   Calcium 9.0 8.4 - 10.5 mg/dL   GFR  62.92 >60.00 mL/min  Lipid panel  Result Value Ref Range   Cholesterol 169 0 - 200 mg/dL   Triglycerides 79.0 0.0 - 149.0 mg/dL   HDL 48.40 >39.00 mg/dL   VLDL 15.8 0.0 - 40.0 mg/dL   LDL Cholesterol 105 (H) 0 - 99 mg/dL   Total CHOL/HDL Ratio 4    NonHDL 121.08    Assessment & Plan:   Problem List Items Addressed This Visit  Seasonal allergic rhinitis   PFO (patent foramen ovale)   Hypothyroidism (acquired)    Chronic, stable. Continue thyroid replacement.       HLD (hyperlipidemia)    Chronic, stable off statin. The 10-year ASCVD risk score Mikey Bussing DC Brooke Bonito., et al., 2013) is: 1.8%   Values used to calculate the score:     Age: 41 years     Sex: Male     Is Non-Hispanic African American: No     Diabetic: No     Tobacco smoker: No     Systolic Blood Pressure: 811 mmHg     Is BP treated: No     HDL Cholesterol: 48.4 mg/dL     Total Cholesterol: 169 mg/dL       History of stroke    Continue aspirin daily. Declines statin.       Health maintenance examination - Primary    Preventative protocols reviewed and updated unless pt declined. Discussed healthy diet and lifestyle.           No orders of the defined types were placed in this encounter.  No orders of the defined types were placed in this encounter.   Follow up plan: Return in about 1 year (around 08/13/2019) for annual exam, prior fasting for blood work.  Ria Bush, MD

## 2018-08-12 ENCOUNTER — Encounter: Payer: Self-pay | Admitting: Family Medicine

## 2018-08-12 ENCOUNTER — Ambulatory Visit (INDEPENDENT_AMBULATORY_CARE_PROVIDER_SITE_OTHER): Payer: BC Managed Care – PPO | Admitting: Family Medicine

## 2018-08-12 VITALS — BP 120/84 | HR 91 | Temp 98.4°F | Ht 74.5 in | Wt 238.6 lb

## 2018-08-12 DIAGNOSIS — Q211 Atrial septal defect: Secondary | ICD-10-CM | POA: Diagnosis not present

## 2018-08-12 DIAGNOSIS — E785 Hyperlipidemia, unspecified: Secondary | ICD-10-CM | POA: Diagnosis not present

## 2018-08-12 DIAGNOSIS — Z8673 Personal history of transient ischemic attack (TIA), and cerebral infarction without residual deficits: Secondary | ICD-10-CM | POA: Diagnosis not present

## 2018-08-12 DIAGNOSIS — Q2112 Patent foramen ovale: Secondary | ICD-10-CM

## 2018-08-12 DIAGNOSIS — Z0000 Encounter for general adult medical examination without abnormal findings: Secondary | ICD-10-CM

## 2018-08-12 DIAGNOSIS — J302 Other seasonal allergic rhinitis: Secondary | ICD-10-CM

## 2018-08-12 DIAGNOSIS — E039 Hypothyroidism, unspecified: Secondary | ICD-10-CM

## 2018-08-12 NOTE — Assessment & Plan Note (Signed)
Preventative protocols reviewed and updated unless pt declined. Discussed healthy diet and lifestyle.  

## 2018-08-12 NOTE — Assessment & Plan Note (Signed)
Chronic, stable. Continue thyroid replacement.

## 2018-08-12 NOTE — Patient Instructions (Addendum)
You are doing well today.  Continue current medicines Return as needed or in 1 year for next physical.   Health Maintenance, Male A healthy lifestyle and preventive care is important for your health and wellness. Ask your health care provider about what schedule of regular examinations is right for you. What should I know about weight and diet? Eat a Healthy Diet  Eat plenty of vegetables, fruits, whole grains, low-fat dairy products, and lean protein.  Do not eat a lot of foods high in solid fats, added sugars, or salt.  Maintain a Healthy Weight Regular exercise can help you achieve or maintain a healthy weight. You should:  Do at least 150 minutes of exercise each week. The exercise should increase your heart rate and make you sweat (moderate-intensity exercise).  Do strength-training exercises at least twice a week. Watch Your Levels of Cholesterol and Blood Lipids  Have your blood tested for lipids and cholesterol every 5 years starting at 48 years of age. If you are at high risk for heart disease, you should start having your blood tested when you are 48 years old. You may need to have your cholesterol levels checked more often if: ? Your lipid or cholesterol levels are high. ? You are older than 48 years of age. ? You are at high risk for heart disease. What should I know about cancer screening? Many types of cancers can be detected early and may often be prevented. Lung Cancer  You should be screened every year for lung cancer if: ? You are a current smoker who has smoked for at least 30 years. ? You are a former smoker who has quit within the past 15 years.  Talk to your health care provider about your screening options, when you should start screening, and how often you should be screened. Colorectal Cancer  Routine colorectal cancer screening usually begins at 48 years of age and should be repeated every 5-10 years until you are 48 years old. You may need to be screened  more often if early forms of precancerous polyps or small growths are found. Your health care provider may recommend screening at an earlier age if you have risk factors for colon cancer.  Your health care provider may recommend using home test kits to check for hidden blood in the stool.  A small camera at the end of a tube can be used to examine your colon (sigmoidoscopy or colonoscopy). This checks for the earliest forms of colorectal cancer. Prostate and Testicular Cancer  Depending on your age and overall health, your health care provider may do certain tests to screen for prostate and testicular cancer.  Talk to your health care provider about any symptoms or concerns you have about testicular or prostate cancer. Skin Cancer  Check your skin from head to toe regularly.  Tell your health care provider about any new moles or changes in moles, especially if: ? There is a change in a mole's size, shape, or color. ? You have a mole that is larger than a pencil eraser.  Always use sunscreen. Apply sunscreen liberally and repeat throughout the day.  Protect yourself by wearing long sleeves, pants, a wide-brimmed hat, and sunglasses when outside. What should I know about heart disease, diabetes, and high blood pressure?  If you are 36-96 years of age, have your blood pressure checked every 3-5 years. If you are 58 years of age or older, have your blood pressure checked every year. You should have your  blood pressure measured twice-once when you are at a hospital or clinic, and once when you are not at a hospital or clinic. Record the average of the two measurements. To check your blood pressure when you are not at a hospital or clinic, you can use: ? An automated blood pressure machine at a pharmacy. ? A home blood pressure monitor.  Talk to your health care provider about your target blood pressure.  If you are between 23-54 years old, ask your health care provider if you should take  aspirin to prevent heart disease.  Have regular diabetes screenings by checking your fasting blood sugar level. ? If you are at a normal weight and have a low risk for diabetes, have this test once every three years after the age of 24. ? If you are overweight and have a high risk for diabetes, consider being tested at a younger age or more often.  A one-time screening for abdominal aortic aneurysm (AAA) by ultrasound is recommended for men aged 96-75 years who are current or former smokers. What should I know about preventing infection? Hepatitis B If you have a higher risk for hepatitis B, you should be screened for this virus. Talk with your health care provider to find out if you are at risk for hepatitis B infection. Hepatitis C Blood testing is recommended for:  Everyone born from [birth year] through 1965.  Anyone with known risk factors for hepatitis C. Sexually Transmitted Diseases (STDs)  You should be screened each year for STDs including gonorrhea and chlamydia if: ? You are sexually active and are younger than 48 years of age. ? You are older than 48 years of age and your health care provider tells you that you are at risk for this type of infection. ? Your sexual activity has changed since you were last screened and you are at an increased risk for chlamydia or gonorrhea. Ask your health care provider if you are at risk.  Talk with your health care provider about whether you are at high risk of being infected with HIV. Your health care provider may recommend a prescription medicine to help prevent HIV infection. What else can I do?  Schedule regular health, dental, and eye exams.  Stay current with your vaccines (immunizations).  Do not use any tobacco products, such as cigarettes, chewing tobacco, and e-cigarettes. If you need help quitting, ask your health care provider.  Limit alcohol intake to no more than 2 drinks per day. One drink equals 12 ounces of beer, 5 ounces of  wine, or 1 ounces of hard liquor.  Do not use street drugs.  Do not share needles.  Ask your health care provider for help if you need support or information about quitting drugs.  Tell your health care provider if you often feel depressed.  Tell your health care provider if you have ever been abused or do not feel safe at home. This information is not intended to replace advice given to you by your health care provider. Make sure you discuss any questions you have with your health care provider. Document Released: 11/28/2007 Document Revised: 01/29/2016 Document Reviewed: 03/05/2015 Elsevier Interactive Patient Education  2019 [employer].

## 2018-08-12 NOTE — Assessment & Plan Note (Signed)
Chronic, stable off statin. The 10-year ASCVD risk score Andrew [name] Andrew [name]., et al., 2013) is: 1.8%   Values used to calculate the score:     Age: 48 years     Sex: Male     Is Non-Hispanic African American: No     Diabetic: No     Tobacco smoker: No     Systolic Blood Pressure: 789 mmHg     Is BP treated: No     HDL Cholesterol: 48.4 mg/dL     Total Cholesterol: 169 mg/dL

## 2018-08-12 NOTE — Assessment & Plan Note (Signed)
Continue aspirin daily. Declines statin.

## 2018-11-05 ENCOUNTER — Other Ambulatory Visit: Payer: Self-pay | Admitting: Family Medicine

## 2019-08-14 ENCOUNTER — Other Ambulatory Visit: Payer: Self-pay | Admitting: Family Medicine

## 2019-08-18 ENCOUNTER — Other Ambulatory Visit: Payer: Self-pay

## 2019-08-18 ENCOUNTER — Other Ambulatory Visit: Payer: Self-pay | Admitting: Family Medicine

## 2019-08-18 ENCOUNTER — Other Ambulatory Visit (INDEPENDENT_AMBULATORY_CARE_PROVIDER_SITE_OTHER): Payer: BC Managed Care – PPO

## 2019-08-18 DIAGNOSIS — Z125 Encounter for screening for malignant neoplasm of prostate: Secondary | ICD-10-CM | POA: Diagnosis not present

## 2019-08-18 DIAGNOSIS — E039 Hypothyroidism, unspecified: Secondary | ICD-10-CM

## 2019-08-18 DIAGNOSIS — E785 Hyperlipidemia, unspecified: Secondary | ICD-10-CM

## 2019-08-18 LAB — COMPREHENSIVE METABOLIC PANEL
ALT: 26 U/L (ref 0–53)
AST: 18 U/L (ref 0–37)
Albumin: 4.4 g/dL (ref 3.5–5.2)
Alkaline Phosphatase: 61 U/L (ref 39–117)
BUN: 16 mg/dL (ref 6–23)
CO2: 29 mEq/L (ref 19–32)
Calcium: 9.6 mg/dL (ref 8.4–10.5)
Chloride: 102 mEq/L (ref 96–112)
Creatinine, Ser: 1.23 mg/dL (ref 0.40–1.50)
GFR: 62.65 mL/min (ref >60.00)
Glucose, Bld: 96 mg/dL (ref 70–99)
Potassium: 4.5 mEq/L (ref 3.5–5.1)
Sodium: 138 mEq/L (ref 135–145)
Total Bilirubin: 0.8 mg/dL (ref 0.2–1.2)
Total Protein: 7.6 g/dL (ref 6.0–8.3)

## 2019-08-18 LAB — LIPID PANEL
Cholesterol: 172 mg/dL (ref 0–200)
HDL: 45 mg/dL (ref >39.00)
LDL Cholesterol: 103 mg/dL — ABNORMAL HIGH (ref 0–99)
NonHDL: 127.16
Total CHOL/HDL Ratio: 4
Triglycerides: 120 mg/dL (ref 0.0–149.0)
VLDL: 24 mg/dL (ref 0.0–40.0)

## 2019-08-18 LAB — PSA: PSA: 1.22 ng/mL (ref 0.10–4.00)

## 2019-08-18 LAB — TSH: TSH: 4.17 u[IU]/mL (ref 0.35–4.50)

## 2019-08-22 ENCOUNTER — Encounter: Payer: Self-pay | Admitting: Family Medicine

## 2019-08-22 ENCOUNTER — Ambulatory Visit (INDEPENDENT_AMBULATORY_CARE_PROVIDER_SITE_OTHER): Payer: BC Managed Care – PPO | Admitting: Family Medicine

## 2019-08-22 ENCOUNTER — Other Ambulatory Visit: Payer: Self-pay

## 2019-08-22 VITALS — BP 118/78 | HR 80 | Temp 97.7°F | Ht 74.5 in | Wt 240.0 lb

## 2019-08-22 DIAGNOSIS — Q211 Atrial septal defect: Secondary | ICD-10-CM

## 2019-08-22 DIAGNOSIS — Z1283 Encounter for screening for malignant neoplasm of skin: Secondary | ICD-10-CM

## 2019-08-22 DIAGNOSIS — E039 Hypothyroidism, unspecified: Secondary | ICD-10-CM

## 2019-08-22 DIAGNOSIS — Z8673 Personal history of transient ischemic attack (TIA), and cerebral infarction without residual deficits: Secondary | ICD-10-CM | POA: Diagnosis not present

## 2019-08-22 DIAGNOSIS — Q2112 Patent foramen ovale: Secondary | ICD-10-CM

## 2019-08-22 DIAGNOSIS — Z0000 Encounter for general adult medical examination without abnormal findings: Secondary | ICD-10-CM

## 2019-08-22 DIAGNOSIS — E785 Hyperlipidemia, unspecified: Secondary | ICD-10-CM | POA: Diagnosis not present

## 2019-08-22 MED ORDER — LEVOTHYROXINE SODIUM 112 MCG PO TABS: 112.0000 ug | ORAL_TABLET | Freq: Every day | ORAL | 4 refills | Status: DC

## 2019-08-22 NOTE — Assessment & Plan Note (Signed)
Preventative protocols reviewed and updated unless pt declined. Discussed healthy diet and lifestyle.  

## 2019-08-22 NOTE — Patient Instructions (Addendum)
Call insurance to see if it will cover screening colonoscopy for you and let me know.  We will refer you to dermatologist.  Increase levothyroxine to 151mg daily - new dose at pharmacy.  Return as needed or in 1 year for next physical  Health Maintenance, Male Adopting a healthy lifestyle and getting preventive care are important in promoting health and wellness. Ask your health care provider about:  The right schedule for you to have regular tests and exams.  Things you can do on your own to prevent diseases and keep yourself healthy. What should I know about diet, weight, and exercise? Eat a healthy diet   Eat a diet that includes plenty of vegetables, fruits, low-fat dairy products, and lean protein.  Do not eat a lot of foods that are high in solid fats, added sugars, or sodium. Maintain a healthy weight Body mass index (BMI) is a measurement that can be used to identify possible weight problems. It estimates body fat based on height and weight. Your health care provider can help determine your BMI and help you achieve or maintain a healthy weight. Get regular exercise Get regular exercise. This is one of the most important things you can do for your health. Most adults should:  Exercise for at least 150 minutes each week. The exercise should increase your heart rate and make you sweat (moderate-intensity exercise).  Do strengthening exercises at least twice a week. This is in addition to the moderate-intensity exercise.  Spend less time sitting. Even light physical activity can be beneficial. Watch cholesterol and blood lipids Have your blood tested for lipids and cholesterol at 49years of age, then have this test every 5 years. You may need to have your cholesterol levels checked more often if:  Your lipid or cholesterol levels are high.  You are older than 49years of age.  You are at high risk for heart disease. What should I know about cancer screening? Many types of  cancers can be detected early and may often be prevented. Depending on your health history and family history, you may need to have cancer screening at various ages. This may include screening for:  Colorectal cancer.  Prostate cancer.  Skin cancer.  Lung cancer. What should I know about heart disease, diabetes, and high blood pressure? Blood pressure and heart disease  High blood pressure causes heart disease and increases the risk of stroke. This is more likely to develop in people who have high blood pressure readings, are of African descent, or are overweight.  Talk with your health care provider about your target blood pressure readings.  Have your blood pressure checked: ? Every 3-5 years if you are 148352years of age. ? Every year if you are 421years old or older.  If you are between the ages of 651and 765and are a current or former smoker, ask your health care provider if you should have a one-time screening for abdominal aortic aneurysm (AAA). Diabetes Have regular diabetes screenings. This checks your fasting blood sugar level. Have the screening done:  Once every three years after age 6249if you are at a normal weight and have a low risk for diabetes.  More often and at a younger age if you are overweight or have a high risk for diabetes. What should I know about preventing infection? Hepatitis B If you have a higher risk for hepatitis B, you should be screened for this virus. Talk with your health care provider  to find out if you are at risk for hepatitis B infection. Hepatitis C Blood testing is recommended for:  Everyone born from [birth year] through 1965.  Anyone with known risk factors for hepatitis C. Sexually transmitted infections (STIs)  You should be screened each year for STIs, including gonorrhea and chlamydia, if: ? You are sexually active and are younger than 49 years of age. ? You are older than 49 years of age and your health care provider tells you that you  are at risk for this type of infection. ? Your sexual activity has changed since you were last screened, and you are at increased risk for chlamydia or gonorrhea. Ask your health care provider if you are at risk.  Ask your health care provider about whether you are at high risk for HIV. Your health care provider may recommend a prescription medicine to help prevent HIV infection. If you choose to take medicine to prevent HIV, you should first get tested for HIV. You should then be tested every 3 months for as long as you are taking the medicine. Follow these instructions at home: Lifestyle  Do not use any products that contain nicotine or tobacco, such as cigarettes, e-cigarettes, and chewing tobacco. If you need help quitting, ask your health care provider.  Do not use street drugs.  Do not share needles.  Ask your health care provider for help if you need support or information about quitting drugs. Alcohol use  Do not drink alcohol if your health care provider tells you not to drink.  If you drink alcohol: ? Limit how much you have to 0-2 drinks a day. ? Be aware of how much alcohol is in your drink. In the [place] one drink equals one 12 oz bottle of beer (355 mL), one 5 oz glass of wine (148 mL), or one 1 oz glass of hard liquor (44 mL). General instructions  Schedule regular health, dental, and eye exams.  Stay current with your vaccines.  Tell your health care provider if: ? You often feel depressed. ? You have ever been abused or do not feel safe at home. Summary  Adopting a healthy lifestyle and getting preventive care are important in promoting health and wellness.  Follow your health care provider's instructions about healthy diet, exercising, and getting tested or screened for diseases.  Follow your health care provider's instructions on monitoring your cholesterol and blood pressure. This information is not intended to replace advice given to you by your health care  provider. Make sure you discuss any questions you have with your health care provider. Document Revised: 05/25/2018 Document Reviewed: 05/25/2018 Elsevier Patient Education  2020 [employer].

## 2019-08-22 NOTE — Progress Notes (Signed)
This visit was conducted in person.  BP 118/78 (BP Location: Left Arm, Patient Position: Sitting, Cuff Size: Large)   Pulse 80   Temp 97.7 F (36.5 C) (Temporal)   Ht 6' 2.5" (1.892 m)   Wt 240 lb (108.9 kg)   SpO2 98%   BMI 30.40 kg/m    CC: CPE Subjective:    Patient ID: Andrew [name], male    DOB: August 19, 1970, 49 y.o.   MRN: [id number]  HPI: Andrew [name] is a 49 y.o. male presenting on 08/22/2019 for Annual Exam   Notes increasing fatigue, muscle tightness. Some chronic cold intolerance. No constipation, skin or hair changes.   H/o Rlateral midbrain (mesencephalic lesion)CVA by MRIage 49yo?PFO related. Has seen neurology. No records available Leonie Man).   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 No h/o respiratory trouble.   Preventative: Colon cancer screening - discussed - would prefer colonoscopy in [place].  Prostate cancer screening - father with h/o this. DRE/PSA reassuring. Discussed spacing out DRE. Asxs.  Flu shot - declines Tdap2018 Covid vaccine - considering. 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 1 changing mole on back - requests derm referral.  Non smoker Alcohol -rare Dentist q6 mo Eye exam yearly  Lives with wife and 2 children, 2 dogs, cows  Occ: Recruitment consultant for [employer]  Edu: college  Activity: staysactive on job, works farm on weekends, some walking  Diet: good water, fruits/vegetables daily      Relevant past medical, surgical, family and social history reviewed and updated as indicated. Interim medical history since our last visit reviewed. Allergies and medications reviewed and updated. Outpatient Medications Prior to Visit  Medication Sig Dispense Refill  . aspirin EC 81 MG tablet Take 81 mg by mouth daily.    . Levocetirizine Dihydrochloride (XYZAL PO) Take by mouth. Takes seasonally    . triamcinolone (NASACORT) 55  MCG/ACT AERO nasal inhaler Place 2 sprays into the nose daily. 1 Inhaler 11  . levothyroxine (SYNTHROID) 100 MCG tablet TAKE 1 TABLET BY MOUTH EVERY DAY 90 tablet 0   No facility-administered medications prior to visit.     Per HPI unless specifically indicated in ROS section below Review of Systems  Constitutional: Negative for activity change, appetite change, chills, fatigue, fever and unexpected weight change.  HENT: Negative for hearing loss.   Eyes: Negative for visual disturbance.  Respiratory: Negative for cough, chest tightness, shortness of breath and wheezing.   Cardiovascular: Negative for chest pain, palpitations and leg swelling.  Gastrointestinal: Negative for abdominal distention, abdominal pain, blood in stool, constipation, diarrhea, nausea and vomiting.  Genitourinary: Negative for difficulty urinating and hematuria.  Musculoskeletal: Negative for arthralgias, myalgias and neck pain.  Skin: Negative for rash.  Neurological: Negative for dizziness, seizures, syncope and headaches.  Hematological: Negative for adenopathy. Does not bruise/bleed easily.  Psychiatric/Behavioral: Negative for dysphoric mood. The patient is not nervous/anxious.    Objective:    BP 118/78 (BP Location: Left Arm, Patient Position: Sitting, Cuff Size: Large)   Pulse 80   Temp 97.7 F (36.5 C) (Temporal)   Ht 6' 2.5" (1.892 m)   Wt 240 lb (108.9 kg)   SpO2 98%   BMI 30.40 kg/m   Wt Readings from Last 3 Encounters:  08/22/19 240 lb (108.9 kg)  08/12/18 238 lb 9 oz (108.2 kg)  08/06/17 235 lb (106.6 kg)  Physical Exam Vitals and nursing note reviewed.  Constitutional:      General: He is not in acute distress.    Appearance: Normal appearance. He is well-developed. He is not ill-appearing.  HENT:     Head: Normocephalic and atraumatic.     Right Ear: Hearing, tympanic membrane, ear canal and external ear normal.     Left Ear: Hearing, tympanic membrane, ear canal and external ear  normal.     Mouth/Throat:     Pharynx: Uvula midline.  Eyes:     General: No scleral icterus.    Extraocular Movements: Extraocular movements intact.     Conjunctiva/sclera: Conjunctivae normal.     Pupils: Pupils are equal, round, and reactive to light.  Cardiovascular:     Rate and Rhythm: Normal rate and regular rhythm.     Pulses: Normal pulses.          Radial pulses are 2+ on the right side and 2+ on the left side.     Heart sounds: Normal heart sounds. No murmur.  Pulmonary:     Effort: Pulmonary effort is normal. No respiratory distress.     Breath sounds: Normal breath sounds. No wheezing, rhonchi or rales.  Abdominal:     General: Abdomen is flat. Bowel sounds are normal. There is no distension.     Palpations: Abdomen is soft. There is no mass.     Tenderness: There is no abdominal tenderness. There is no guarding or rebound.     Hernia: No hernia is present.  Musculoskeletal:        General: Normal range of motion.     Cervical back: Normal range of motion and neck supple.     Right lower leg: No edema.     Left lower leg: No edema.  Lymphadenopathy:     Cervical: No cervical adenopathy.  Skin:    General: Skin is warm and dry.     Findings: No rash.  Neurological:     General: No focal deficit present.     Mental Status: He is alert and oriented to person, place, and time.     Comments: CN grossly intact, station and gait intact  Psychiatric:        Mood and Affect: Mood normal.        Behavior: Behavior normal.        Thought Content: Thought content normal.        Judgment: Judgment normal.       Results for orders placed or performed in visit on 08/18/19  TSH  Result Value Ref Range   TSH 4.17 0.35 - 4.50 uIU/mL  PSA  Result Value Ref Range   PSA 1.22 0.10 - 4.00 ng/mL  Lipid panel  Result Value Ref Range   Cholesterol 172 0 - 200 mg/dL   Triglycerides 120.0 0.0 - 149.0 mg/dL   HDL 45.00 >39.00 mg/dL   VLDL 24.0 0.0 - 40.0 mg/dL   LDL  Cholesterol 103 (H) 0 - 99 mg/dL   Total CHOL/HDL Ratio 4    NonHDL 127.16   Comprehensive metabolic panel  Result Value Ref Range   Sodium 138 135 - 145 mEq/L   Potassium 4.5 3.5 - 5.1 mEq/L   Chloride 102 96 - 112 mEq/L   CO2 29 19 - 32 mEq/L   Glucose, Bld 96 70 - 99 mg/dL   BUN 16 6 - 23 mg/dL   Creatinine, Ser 1.23 0.40 - 1.50 mg/dL   Total Bilirubin 0.8  0.2 - 1.2 mg/dL   Alkaline Phosphatase 61 39 - 117 U/L   AST 18 0 - 37 U/L   ALT 26 0 - 53 U/L   Total Protein 7.6 6.0 - 8.3 g/dL   Albumin 4.4 3.5 - 5.2 g/dL   GFR 62.65 >60.00 mL/min   Calcium 9.6 8.4 - 10.5 mg/dL   Assessment & Plan:  This visit occurred during the SARS-CoV-2 public health emergency.  Safety protocols were in place, including screening questions prior to the visit, additional usage of staff PPE, and extensive cleaning of exam room while observing appropriate contact time as indicated for disinfecting solutions.   Problem List Items Addressed This Visit    PFO (patent foramen ovale)    No murmur appreciated.       Hypothyroidism (acquired)    TSH high normal, endorsing some fatigue and chronic cold intolerance - will trial higher dose 154mg daily.       Relevant Medications   levothyroxine (SYNTHROID) 112 MCG tablet   HLD (hyperlipidemia)    Mild, off medication. The 10-year ASCVD risk score (Mikey BussingDC JBrooke Bonito, et al., 2013) is: 2.2%   Values used to calculate the score:     Age: [age]years     Sex: Male     Is Non-Hispanic African American: No     Diabetic: No     Tobacco smoker: No     Systolic Blood Pressure: 1100mmHg     Is BP treated: No     HDL Cholesterol: 45 mg/dL     Total Cholesterol: 172 mg/dL       History of stroke    Continue aspirin.       Health maintenance examination - Primary    Preventative protocols reviewed and updated unless pt declined. Discussed healthy diet and lifestyle.        Other Visit Diagnoses    Skin cancer screening       Relevant Orders   Ambulatory  referral to Dermatology       Meds ordered this encounter  Medications  . levothyroxine (SYNTHROID) 112 MCG tablet    Sig: Take 1 tablet (112 mcg total) by mouth daily before breakfast.    Dispense:  90 tablet    Refill:  4   Orders Placed This Encounter  Procedures  . Ambulatory referral to Dermatology    Referral Priority:   Routine    Referral Type:   Consultation    Referral Reason:   Specialty Services Required    Requested Specialty:   Dermatology    Number of Visits Requested:   1    Patient instructions: Call insurance to see if it will cover screening colonoscopy for you and let me know.  We will refer you to dermatologist.  Increase levothyroxine to 1172m daily - new dose at pharmacy.  Return as needed or in 1 year for next physical  Follow up plan: Return in about 1 year (around 08/21/2020) for annual exam, prior fasting for blood work.  JaRia BushMD

## 2019-08-22 NOTE — Assessment & Plan Note (Signed)
Mild, off medication. The 10-year ASCVD risk score Mikey Bussing DC Brooke Bonito., et al., 2013) is: 2.2%   Values used to calculate the score:     Age: 49 years     Sex: Male     Is Non-Hispanic African American: No     Diabetic: No     Tobacco smoker: No     Systolic Blood Pressure: 376 mmHg     Is BP treated: No     HDL Cholesterol: 45 mg/dL     Total Cholesterol: 172 mg/dL

## 2019-08-22 NOTE — Assessment & Plan Note (Signed)
No murmur appreciated.

## 2019-08-22 NOTE — Assessment & Plan Note (Signed)
Continue aspirin 

## 2019-08-22 NOTE — Assessment & Plan Note (Signed)
TSH high normal, endorsing some fatigue and chronic cold intolerance - will trial higher dose 116mg daily.

## 2020-08-25 ENCOUNTER — Other Ambulatory Visit: Payer: Self-pay | Admitting: Family Medicine

## 2020-08-25 DIAGNOSIS — E785 Hyperlipidemia, unspecified: Secondary | ICD-10-CM

## 2020-08-25 DIAGNOSIS — E039 Hypothyroidism, unspecified: Secondary | ICD-10-CM

## 2020-08-25 DIAGNOSIS — Z125 Encounter for screening for malignant neoplasm of prostate: Secondary | ICD-10-CM

## 2020-08-26 ENCOUNTER — Other Ambulatory Visit (INDEPENDENT_AMBULATORY_CARE_PROVIDER_SITE_OTHER): Payer: BC Managed Care – PPO

## 2020-08-26 ENCOUNTER — Other Ambulatory Visit: Payer: Self-pay

## 2020-08-26 DIAGNOSIS — Z125 Encounter for screening for malignant neoplasm of prostate: Secondary | ICD-10-CM

## 2020-08-26 DIAGNOSIS — E785 Hyperlipidemia, unspecified: Secondary | ICD-10-CM

## 2020-08-26 DIAGNOSIS — E039 Hypothyroidism, unspecified: Secondary | ICD-10-CM | POA: Diagnosis not present

## 2020-08-26 LAB — LIPID PANEL
Cholesterol: 161 mg/dL (ref 0–200)
HDL: 48 mg/dL (ref >39.00)
LDL Cholesterol: 92 mg/dL (ref 0–99)
NonHDL: 112.77
Total CHOL/HDL Ratio: 3
Triglycerides: 104 mg/dL (ref 0.0–149.0)
VLDL: 20.8 mg/dL (ref 0.0–40.0)

## 2020-08-26 LAB — COMPREHENSIVE METABOLIC PANEL
ALT: 24 U/L (ref 0–53)
AST: 17 U/L (ref 0–37)
Albumin: 4.3 g/dL (ref 3.5–5.2)
Alkaline Phosphatase: 57 U/L (ref 39–117)
BUN: 15 mg/dL (ref 6–23)
CO2: 31 mEq/L (ref 19–32)
Calcium: 9.5 mg/dL (ref 8.4–10.5)
Chloride: 103 mEq/L (ref 96–112)
Creatinine, Ser: 1.26 mg/dL (ref 0.40–1.50)
GFR: 66.93 mL/min (ref >60.00)
Glucose, Bld: 100 mg/dL — ABNORMAL HIGH (ref 70–99)
Potassium: 4.2 mEq/L (ref 3.5–5.1)
Sodium: 139 mEq/L (ref 135–145)
Total Bilirubin: 0.9 mg/dL (ref 0.2–1.2)
Total Protein: 7.2 g/dL (ref 6.0–8.3)

## 2020-08-26 LAB — PSA: PSA: 1.11 ng/mL (ref 0.10–4.00)

## 2020-08-26 LAB — T4, FREE: Free T4: 0.89 ng/dL (ref 0.60–1.60)

## 2020-08-26 LAB — TSH: TSH: 3.08 u[IU]/mL (ref 0.35–4.50)

## 2020-08-30 ENCOUNTER — Telehealth: Payer: Self-pay

## 2020-08-30 ENCOUNTER — Encounter: Payer: Self-pay | Admitting: Family Medicine

## 2020-08-30 ENCOUNTER — Ambulatory Visit (INDEPENDENT_AMBULATORY_CARE_PROVIDER_SITE_OTHER): Payer: BC Managed Care – PPO | Admitting: Family Medicine

## 2020-08-30 VITALS — BP 118/78 | HR 77 | Temp 97.5°F | Ht 74.5 in | Wt 239.3 lb

## 2020-08-30 DIAGNOSIS — Z0000 Encounter for general adult medical examination without abnormal findings: Secondary | ICD-10-CM | POA: Diagnosis not present

## 2020-08-30 DIAGNOSIS — E785 Hyperlipidemia, unspecified: Secondary | ICD-10-CM

## 2020-08-30 DIAGNOSIS — Z8673 Personal history of transient ischemic attack (TIA), and cerebral infarction without residual deficits: Secondary | ICD-10-CM | POA: Diagnosis not present

## 2020-08-30 DIAGNOSIS — E039 Hypothyroidism, unspecified: Secondary | ICD-10-CM

## 2020-08-30 DIAGNOSIS — Q2112 Patent foramen ovale: Secondary | ICD-10-CM

## 2020-08-30 DIAGNOSIS — Q211 Atrial septal defect: Secondary | ICD-10-CM

## 2020-08-30 DIAGNOSIS — J302 Other seasonal allergic rhinitis: Secondary | ICD-10-CM

## 2020-08-30 MED ORDER — LEVOTHYROXINE SODIUM 112 MCG PO TABS: 112.0000 ug | ORAL_TABLET | Freq: Every day | ORAL | 4 refills | Status: DC

## 2020-08-30 NOTE — Assessment & Plan Note (Signed)
Preventative protocols reviewed and updated unless pt declined. Discussed healthy diet and lifestyle.  

## 2020-08-30 NOTE — Assessment & Plan Note (Signed)
Discussed antihistamine and ICS use.

## 2020-08-30 NOTE — Telephone Encounter (Signed)
Faxed order for Cologuard to [facility].

## 2020-08-30 NOTE — Assessment & Plan Note (Signed)
Chronic, stable. Endorses some ongoing hypothyroid symptoms - will increase levothyroxine dose by 1/2 tablet weekly. Reassess next year.

## 2020-08-30 NOTE — Progress Notes (Signed)
Patient ID: Andrew [name], male    DOB: [birth year], 50 y.o.   MRN: [id number]  This visit was conducted in person.  BP 118/78   [name] 77   Temp (!) 97.5 F (36.4 C) (Temporal)   Ht 6' 2.5" (1.892 m)   Wt 239 lb 5 oz (108.6 kg)   SpO2 97%   BMI 30.31 kg/m    CC: CPE Subjective:   HPI: Andrew [name] is a 50 y.o. male presenting on 08/30/2020 for Annual Exam   H/o Rlateral midbrain (mesencephalic lesion)CVA by MRIage 50yo?PFO related. Has seen neurology Leonie Man). No records available.   Last visit we increased thyroid dose due to some hypothyroid symptoms. Did better with this, but recently over last few months notes increasing fatigue. Notes some trouble with weight loss.   Allergic rhinitis - regularly uses nasacort, xyzal PRN. Longstanding history of sinus headaches due to allergies - some have recently recurred. Sometimes afrin helps, sometimes worsens symptoms.   Preventative: Colon cancer screening - discussed - would like cologuard  Prostate cancer screening - father with h/o this.DRE/PSA previously reassuring. No nocturia, strong stream.  Flu shot - declines COVID vaccine - declines  [id number]  Seat belt use discussed  Sunscreen use discussed.He is outside a lot. Saw derm last year  Non smoker  Alcohol -rare  Dentist q6 mo Eye exam yearly   Lives with wife and 2 children, 2 dogs, cows on farm Occ: Recruitment consultant for [employer]  Edu: college  Activity: staysactive on job, works farm on weekends, some walking  Diet: good water, fruits/vegetables daily      Relevant past medical, surgical, family and social history reviewed and updated as indicated. Interim medical history since our last visit reviewed. Allergies and medications reviewed and updated. Outpatient Medications Prior to Visit  Medication Sig Dispense Refill  . aspirin EC 81 MG tablet Take 81 mg by mouth daily.    . Levocetirizine Dihydrochloride (XYZAL PO) Take by mouth. Takes seasonally     . triamcinolone (NASACORT) 55 MCG/ACT AERO nasal inhaler Place 2 sprays into the nose daily. 1 Inhaler 11  . levothyroxine (SYNTHROID) 112 MCG tablet Take 1 tablet (112 mcg total) by mouth daily before breakfast. 90 tablet 4   No facility-administered medications prior to visit.     Per HPI unless specifically indicated in ROS section below Review of Systems  Constitutional: Negative for activity change, appetite change, chills, fatigue, fever and unexpected weight change.  HENT: Negative for hearing loss.   Eyes: Negative for visual disturbance.  Respiratory: Negative for cough, chest tightness, shortness of breath and wheezing.   Cardiovascular: Negative for chest pain, palpitations and leg swelling.  Gastrointestinal: Negative for abdominal distention, abdominal pain, blood in stool, constipation, diarrhea, nausea and vomiting.  Genitourinary: Negative for difficulty urinating and hematuria.  Musculoskeletal: Negative for arthralgias, myalgias and neck pain.  Skin: Negative for rash.  Neurological: Negative for dizziness, seizures, syncope and headaches.  Hematological: Negative for adenopathy. Does not bruise/bleed easily.  Psychiatric/Behavioral: Negative for dysphoric mood. The patient is not nervous/anxious.    Objective:  BP 118/78   [name] 77   Temp (!) 97.5 F (36.4 C) (Temporal)   Ht 6' 2.5" (1.892 m)   Wt 239 lb 5 oz (108.6 kg)   SpO2 97%   BMI 30.31 kg/m   Wt Readings from Last 3 Encounters:  08/30/20 239 lb 5 oz (108.6 kg)  08/22/19 240 lb (108.9 kg)  08/12/18 238 lb 9  oz (108.2 kg)      Physical Exam Vitals and nursing note reviewed.  Constitutional:      General: He is not in acute distress.    Appearance: Normal appearance. He is well-developed. He is not ill-appearing.  HENT:     Head: Normocephalic and atraumatic.     Right Ear: Hearing, tympanic membrane, ear canal and external ear normal.     Left Ear: Hearing, tympanic membrane, ear canal and  external ear normal.  Eyes:     General: No scleral icterus.    Extraocular Movements: Extraocular movements intact.     Conjunctiva/sclera: Conjunctivae normal.     Pupils: Pupils are equal, round, and reactive to light.  Neck:     Thyroid: No thyroid mass or thyromegaly.  Cardiovascular:     Rate and Rhythm: Normal rate and regular rhythm.     Pulses: Normal pulses.          Radial pulses are 2+ on the right side and 2+ on the left side.     Heart sounds: Normal heart sounds. No murmur heard.   Pulmonary:     Effort: Pulmonary effort is normal. No respiratory distress.     Breath sounds: Normal breath sounds. No wheezing, rhonchi or rales.  Abdominal:     General: Abdomen is flat. Bowel sounds are normal. There is no distension.     Palpations: Abdomen is soft. There is no mass.     Tenderness: There is no abdominal tenderness. There is no guarding or rebound.     Hernia: No hernia is present.  Musculoskeletal:        General: Normal range of motion.     Cervical back: Normal range of motion and neck supple.     Right lower leg: No edema.     Left lower leg: No edema.  Lymphadenopathy:     Cervical: No cervical adenopathy.  Skin:    General: Skin is warm and dry.     Findings: No rash.  Neurological:     General: No focal deficit present.     Mental Status: He is alert and oriented to person, place, and time.     Comments: CN grossly intact, station and gait intact  Psychiatric:        Mood and Affect: Mood normal.        Behavior: Behavior normal.        Thought Content: Thought content normal.        Judgment: Judgment normal.       Results for orders placed or performed in visit on 08/26/20  T4, free  Result Value Ref Range   Free T4 0.89 0.60 - 1.60 ng/dL  PSA  Result Value Ref Range   PSA 1.11 0.10 - 4.00 ng/mL  TSH  Result Value Ref Range   TSH 3.08 0.35 - 4.50 uIU/mL  Lipid panel  Result Value Ref Range   Cholesterol 161 0 - 200 mg/dL   Triglycerides  104.0 0.0 - 149.0 mg/dL   HDL 48.00 >39.00 mg/dL   VLDL 20.8 0.0 - 40.0 mg/dL   LDL Cholesterol 92 0 - 99 mg/dL   Total CHOL/HDL Ratio 3    NonHDL 112.77   Comprehensive metabolic panel  Result Value Ref Range   Sodium 139 135 - 145 mEq/L   Potassium 4.2 3.5 - 5.1 mEq/L   Chloride 103 96 - 112 mEq/L   CO2 31 19 - 32 mEq/L   Glucose, Bld 100 (  H) 70 - 99 mg/dL   BUN 15 6 - 23 mg/dL   Creatinine, Ser 1.26 0.40 - 1.50 mg/dL   Total Bilirubin 0.9 0.2 - 1.2 mg/dL   Alkaline Phosphatase 57 39 - 117 U/L   AST 17 0 - 37 U/L   ALT 24 0 - 53 U/L   Total Protein 7.2 6.0 - 8.3 g/dL   Albumin 4.3 3.5 - 5.2 g/dL   GFR 66.93 >60.00 mL/min   Calcium 9.5 8.4 - 10.5 mg/dL   Assessment & Plan:  This visit occurred during the SARS-CoV-2 public health emergency.  Safety protocols were in place, including screening questions prior to the visit, additional usage of staff PPE, and extensive cleaning of exam room while observing appropriate contact time as indicated for disinfecting solutions.   Problem List Items Addressed This Visit    HLD (hyperlipidemia)    Improved readings this year, remains off medications. The 10-year ASCVD risk score Mikey Bussing DC Brooke Bonito., et al., 2013) is: 2.1%   Values used to calculate the score:     Age: 56 years     Sex: Male     Is Non-Hispanic African American: No     Diabetic: No     Tobacco smoker: No     Systolic Blood Pressure: 031 mmHg     Is BP treated: No     HDL Cholesterol: 48 mg/dL     Total Cholesterol: 161 mg/dL       History of stroke    Thought PFO related. No murmur today. Continues aspirin 54m daily.       Health maintenance examination - Primary    Preventative protocols reviewed and updated unless pt declined. Discussed healthy diet and lifestyle.       PFO (patent foramen ovale)    No murmur appreciated      Hypothyroidism (acquired)    Chronic, stable. Endorses some ongoing hypothyroid symptoms - will increase levothyroxine dose by 1/2  tablet weekly. Reassess next year.       Relevant Medications   levothyroxine (SYNTHROID) 112 MCG tablet   Seasonal allergic rhinitis    Discussed antihistamine and ICS use.           Meds ordered this encounter  Medications  . levothyroxine (SYNTHROID) 112 MCG tablet    Sig: Take 1 tablet (112 mcg total) by mouth daily before breakfast. Once weekly take extra 1/2 tablet    Dispense:  110 tablet    Refill:  4   No orders of the defined types were placed in this encounter.   Patient instructions: We will sign you up for Cologuard.  Thyroid levels were looking better. Ok to increase levothyroxine by 1/2 tablet weekly.  You are doing well today Return as needed or in 1 year for next physical.  Follow up plan: Return in about 1 year (around 08/30/2021), or if symptoms worsen or fail to improve, for annual exam, prior fasting for blood work.  JRia Bush MD

## 2020-08-30 NOTE — Assessment & Plan Note (Signed)
No murmur appreciated

## 2020-08-30 NOTE — Assessment & Plan Note (Signed)
Improved readings this year, remains off medications. The 10-year ASCVD risk score Mikey Bussing DC Brooke Bonito., et al., 2013) is: 2.1%   Values used to calculate the score:     Age: 50 years     Sex: Male     Is Non-Hispanic African American: No     Diabetic: No     Tobacco smoker: No     Systolic Blood Pressure: 177 mmHg     Is BP treated: No     HDL Cholesterol: 48 mg/dL     Total Cholesterol: 161 mg/dL

## 2020-08-30 NOTE — Patient Instructions (Addendum)
We will sign you up for Cologuard.  Thyroid levels were looking better. Ok to increase levothyroxine by 1/2 tablet weekly.  You are doing well today Return as needed or in 1 year for next physical.  Health Maintenance, Male Adopting a healthy lifestyle and getting preventive care are important in promoting health and wellness. Ask your health care provider about:  The right schedule for you to have regular tests and exams.  Things you can do on your own to prevent diseases and keep yourself healthy. What should I know about diet, weight, and exercise? Eat a healthy diet  Eat a diet that includes plenty of vegetables, fruits, low-fat dairy products, and lean protein.  Do not eat a lot of foods that are high in solid fats, added sugars, or sodium.   Maintain a healthy weight Body mass index (BMI) is a measurement that can be used to identify possible weight problems. It estimates body fat based on height and weight. Your health care provider can help determine your BMI and help you achieve or maintain a healthy weight. Get regular exercise Get regular exercise. This is one of the most important things you can do for your health. Most adults should:  Exercise for at least 150 minutes each week. The exercise should increase your heart rate and make you sweat (moderate-intensity exercise).  Do strengthening exercises at least twice a week. This is in addition to the moderate-intensity exercise.  Spend less time sitting. Even light physical activity can be beneficial. Watch cholesterol and blood lipids Have your blood tested for lipids and cholesterol at 50 years of age, then have this test every 5 years. You may need to have your cholesterol levels checked more often if:  Your lipid or cholesterol levels are high.  You are older than 50 years of age.  You are at high risk for heart disease. What should I know about cancer screening? Many types of cancers can be detected early and may  often be prevented. Depending on your health history and family history, you may need to have cancer screening at various ages. This may include screening for:  Colorectal cancer.  Prostate cancer.  Skin cancer.  Lung cancer. What should I know about heart disease, diabetes, and high blood pressure? Blood pressure and heart disease  High blood pressure causes heart disease and increases the risk of stroke. This is more likely to develop in people who have high blood pressure readings, are of African descent, or are overweight.  Talk with your health care provider about your target blood pressure readings.  Have your blood pressure checked: ? Every 3-5 years if you are 55-77 years of age. ? Every year if you are 36 years old or older.  If you are between the ages of 33 and 58 and are a current or former smoker, ask your health care provider if you should have a one-time screening for abdominal aortic aneurysm (AAA). Diabetes Have regular diabetes screenings. This checks your fasting blood sugar level. Have the screening done:  Once every three years after age 52 if you are at a normal weight and have a low risk for diabetes.  More often and at a younger age if you are overweight or have a high risk for diabetes. What should I know about preventing infection? Hepatitis B If you have a higher risk for hepatitis B, you should be screened for this virus. Talk with your health care provider to find out if you are at  risk for hepatitis B infection. Hepatitis C Blood testing is recommended for:  Everyone born from [birth year] through 1965.  Anyone with known risk factors for hepatitis C. Sexually transmitted infections (STIs)  You should be screened each year for STIs, including gonorrhea and chlamydia, if: ? You are sexually active and are younger than 50 years of age. ? You are older than 50 years of age and your health care provider tells you that you are at risk for this type of  infection. ? Your sexual activity has changed since you were last screened, and you are at increased risk for chlamydia or gonorrhea. Ask your health care provider if you are at risk.  Ask your health care provider about whether you are at high risk for HIV. Your health care provider may recommend a prescription medicine to help prevent HIV infection. If you choose to take medicine to prevent HIV, you should first get tested for HIV. You should then be tested every 3 months for as long as you are taking the medicine. Follow these instructions at home: Lifestyle  Do not use any products that contain nicotine or tobacco, such as cigarettes, e-cigarettes, and chewing tobacco. If you need help quitting, ask your health care provider.  Do not use street drugs.  Do not share needles.  Ask your health care provider for help if you need support or information about quitting drugs. Alcohol use  Do not drink alcohol if your health care provider tells you not to drink.  If you drink alcohol: ? Limit how much you have to 0-2 drinks a day. ? Be aware of how much alcohol is in your drink. In the [place] one drink equals one 12 oz bottle of beer (355 mL), one 5 oz glass of wine (148 mL), or one 1 oz glass of hard liquor (44 mL). General instructions  Schedule regular health, dental, and eye exams.  Stay current with your vaccines.  Tell your health care provider if: ? You often feel depressed. ? You have ever been abused or do not feel safe at home. Summary  Adopting a healthy lifestyle and getting preventive care are important in promoting health and wellness.  Follow your health care provider's instructions about healthy diet, exercising, and getting tested or screened for diseases.  Follow your health care provider's instructions on monitoring your cholesterol and blood pressure. This information is not intended to replace advice given to you by your health care provider. Make sure you  discuss any questions you have with your health care provider. Document Revised: 05/25/2018 Document Reviewed: 05/25/2018 Elsevier Patient Education  2021 [employer].

## 2020-08-30 NOTE — Assessment & Plan Note (Signed)
Thought PFO related. No murmur today. Continues aspirin 54m daily.

## 2020-10-01 LAB — COLOGUARD: Cologuard: NEGATIVE

## 2020-10-08 ENCOUNTER — Telehealth: Payer: Self-pay

## 2020-10-08 NOTE — Telephone Encounter (Signed)
Cologuard results are negative.  Repeat in 3 yrs.   Left message on vm per dpr relaying above message.

## 2021-10-30 ENCOUNTER — Other Ambulatory Visit: Payer: Self-pay | Admitting: Family Medicine

## 2021-10-30 NOTE — Telephone Encounter (Signed)
Spoke with pt scheduling cpe on 01/28/22 at 3:30 and cpe labs on 01/21/22 at 7:30.  E-scribed refill.

## 2022-01-19 ENCOUNTER — Other Ambulatory Visit: Payer: Self-pay | Admitting: Family Medicine

## 2022-01-19 DIAGNOSIS — E785 Hyperlipidemia, unspecified: Secondary | ICD-10-CM

## 2022-01-19 DIAGNOSIS — Z125 Encounter for screening for malignant neoplasm of prostate: Secondary | ICD-10-CM

## 2022-01-19 DIAGNOSIS — Z1159 Encounter for screening for other viral diseases: Secondary | ICD-10-CM

## 2022-01-19 DIAGNOSIS — E039 Hypothyroidism, unspecified: Secondary | ICD-10-CM

## 2022-01-21 ENCOUNTER — Other Ambulatory Visit (INDEPENDENT_AMBULATORY_CARE_PROVIDER_SITE_OTHER): Payer: BC Managed Care – PPO

## 2022-01-21 DIAGNOSIS — Z1159 Encounter for screening for other viral diseases: Secondary | ICD-10-CM

## 2022-01-21 DIAGNOSIS — Z125 Encounter for screening for malignant neoplasm of prostate: Secondary | ICD-10-CM | POA: Diagnosis not present

## 2022-01-21 DIAGNOSIS — E039 Hypothyroidism, unspecified: Secondary | ICD-10-CM | POA: Diagnosis not present

## 2022-01-21 DIAGNOSIS — E785 Hyperlipidemia, unspecified: Secondary | ICD-10-CM | POA: Diagnosis not present

## 2022-01-21 LAB — COMPREHENSIVE METABOLIC PANEL
ALT: 18 U/L (ref 0–53)
AST: 17 U/L (ref 0–37)
Albumin: 4.6 g/dL (ref 3.5–5.2)
Alkaline Phosphatase: 56 U/L (ref 39–117)
BUN: 18 mg/dL (ref 6–23)
CO2: 30 mEq/L (ref 19–32)
Calcium: 9.7 mg/dL (ref 8.4–10.5)
Chloride: 102 mEq/L (ref 96–112)
Creatinine, Ser: 1.31 mg/dL (ref 0.40–1.50)
GFR: 63.25 mL/min (ref >60.00)
Glucose, Bld: 99 mg/dL (ref 70–99)
Potassium: 4.4 mEq/L (ref 3.5–5.1)
Sodium: 139 mEq/L (ref 135–145)
Total Bilirubin: 1.1 mg/dL (ref 0.2–1.2)
Total Protein: 7.4 g/dL (ref 6.0–8.3)

## 2022-01-21 LAB — LIPID PANEL
Cholesterol: 169 mg/dL (ref 0–200)
HDL: 43.2 mg/dL (ref >39.00)
LDL Cholesterol: 106 mg/dL — ABNORMAL HIGH (ref 0–99)
NonHDL: 125.74
Total CHOL/HDL Ratio: 4
Triglycerides: 101 mg/dL (ref 0.0–149.0)
VLDL: 20.2 mg/dL (ref 0.0–40.0)

## 2022-01-21 LAB — TSH: TSH: 6.61 u[IU]/mL — ABNORMAL HIGH (ref 0.35–5.50)

## 2022-01-21 LAB — PSA: PSA: 0.72 ng/mL (ref 0.10–4.00)

## 2022-01-22 LAB — HEPATITIS C ANTIBODY: Hepatitis C Ab: NONREACTIVE

## 2022-01-28 ENCOUNTER — Encounter: Payer: Self-pay | Admitting: Family Medicine

## 2022-01-28 ENCOUNTER — Ambulatory Visit (INDEPENDENT_AMBULATORY_CARE_PROVIDER_SITE_OTHER): Payer: BC Managed Care – PPO | Admitting: Family Medicine

## 2022-01-28 VITALS — BP 118/82 | HR 87 | Temp 97.9°F | Ht 74.25 in | Wt 241.0 lb

## 2022-01-28 DIAGNOSIS — R5382 Chronic fatigue, unspecified: Secondary | ICD-10-CM | POA: Diagnosis not present

## 2022-01-28 DIAGNOSIS — E785 Hyperlipidemia, unspecified: Secondary | ICD-10-CM | POA: Diagnosis not present

## 2022-01-28 DIAGNOSIS — Z8673 Personal history of transient ischemic attack (TIA), and cerebral infarction without residual deficits: Secondary | ICD-10-CM

## 2022-01-28 DIAGNOSIS — E039 Hypothyroidism, unspecified: Secondary | ICD-10-CM | POA: Diagnosis not present

## 2022-01-28 DIAGNOSIS — Z0000 Encounter for general adult medical examination without abnormal findings: Secondary | ICD-10-CM | POA: Diagnosis not present

## 2022-01-28 MED ORDER — LEVOTHYROXINE SODIUM 125 MCG PO TABS: 125.0000 ug | ORAL_TABLET | Freq: Every day | ORAL | 1 refills | Status: DC

## 2022-01-28 NOTE — Assessment & Plan Note (Signed)
Longstanding issue. Will evaluate for reversible causes of fatigue at upcoming labs.

## 2022-01-28 NOTE — Patient Instructions (Addendum)
Increase levothyroxine to daily. Return in 4-6 weeks for 8am labs for repeat thyroid and fatigue labs.  Good to see you today Return as needed or in 1 year for next physical.   Health Maintenance, Male Adopting a healthy lifestyle and getting preventive care are important in promoting health and wellness. Ask your health care provider about: The right schedule for you to have regular tests and exams. Things you can do on your own to prevent diseases and keep yourself healthy. What should I know about diet, weight, and exercise? Eat a healthy diet  Eat a diet that includes plenty of vegetables, fruits, low-fat dairy products, and lean protein. Do not eat a lot of foods that are high in solid fats, added sugars, or sodium. Maintain a healthy weight Body mass index (BMI) is a measurement that can be used to identify possible weight problems. It estimates body fat based on height and weight. Your health care provider can help determine your BMI and help you achieve or maintain a healthy weight. Get regular exercise Get regular exercise. This is one of the most important things you can do for your health. Most adults should: Exercise for at least 150 minutes each week. The exercise should increase your heart rate and make you sweat (moderate-intensity exercise). Do strengthening exercises at least twice a week. This is in addition to the moderate-intensity exercise. Spend less time sitting. Even light physical activity can be beneficial. Watch cholesterol and blood lipids Have your blood tested for lipids and cholesterol at 51 years of age, then have this test every 5 years. You may need to have your cholesterol levels checked more often if: Your lipid or cholesterol levels are high. You are older than 51 years of age. You are at high risk for heart disease. What should I know about cancer screening? Many types of cancers can be detected early and may often be prevented. Depending on  your health history and family history, you may need to have cancer screening at various ages. This may include screening for: Colorectal cancer. Prostate cancer. Skin cancer. Lung cancer. What should I know about heart disease, diabetes, and high blood pressure? Blood pressure and heart disease High blood pressure causes heart disease and increases the risk of stroke. This is more likely to develop in people who have high blood pressure readings or are overweight. Talk with your health care provider about your target blood pressure readings. Have your blood pressure checked: Every 3-5 years if you are 36-4 years of age. Every year if you are 64 years old or older. If you are between the ages of 1 and 76 and are a current or former smoker, ask your health care provider if you should have a one-time screening for abdominal aortic aneurysm (AAA). Diabetes Have regular diabetes screenings. This checks your fasting blood sugar level. Have the screening done: Once every three years after age 30 if you are at a normal weight and have a low risk for diabetes. More often and at a younger age if you are overweight or have a high risk for diabetes. What should I know about preventing infection? Hepatitis B If you have a higher risk for hepatitis B, you should be screened for this virus. Talk with your health care provider to find out if you are at risk for hepatitis B infection. Hepatitis C Blood testing is recommended for: Everyone born from [birth year] through 1965. Anyone with known risk factors for hepatitis C. Sexually transmitted infections (  STIs) You should be screened each year for STIs, including gonorrhea and chlamydia, if: You are sexually active and are younger than 51 years of age. You are older than 51 years of age and your health care provider tells you that you are at risk for this type of infection. Your sexual activity has changed since you were last screened, and you are at increased  risk for chlamydia or gonorrhea. Ask your health care provider if you are at risk. Ask your health care provider about whether you are at high risk for HIV. Your health care provider may recommend a prescription medicine to help prevent HIV infection. If you choose to take medicine to prevent HIV, you should first get tested for HIV. You should then be tested every 3 months for as long as you are taking the medicine. Follow these instructions at home: Alcohol use Do not drink alcohol if your health care provider tells you not to drink. If you drink alcohol: Limit how much you have to 0-2 drinks a day. Know how much alcohol is in your drink. In the [place] one drink equals one 12 oz bottle of beer (355 mL), one 5 oz glass of wine (148 mL), or one 1 oz glass of hard liquor (44 mL). Lifestyle Do not use any products that contain nicotine or tobacco. These products include cigarettes, chewing tobacco, and vaping devices, such as e-cigarettes. If you need help quitting, ask your health care provider. Do not use street drugs. Do not share needles. Ask your health care provider for help if you need support or information about quitting drugs. General instructions Schedule regular health, dental, and eye exams. Stay current with your vaccines. Tell your health care provider if: You often feel depressed. You have ever been abused or do not feel safe at home. Summary Adopting a healthy lifestyle and getting preventive care are important in promoting health and wellness. Follow your health care provider's instructions about healthy diet, exercising, and getting tested or screened for diseases. Follow your health care provider's instructions on monitoring your cholesterol and blood pressure. This information is not intended to replace advice given to you by your health care provider. Make sure you discuss any questions you have with your health care provider. Document Revised: 10/21/2020 Document  Reviewed: 10/21/2020 Elsevier Patient Education  2023 [employer].

## 2022-01-28 NOTE — Progress Notes (Signed)
Patient ID: Andrew [name], male    DOB: 11/12/1970, 51 y.o.   MRN: [id number]  This visit was conducted in person.  BP 118/82   Pulse 87   Temp 97.9 F (36.6 C) (Temporal)   Ht 6' 2.25" (1.886 m)   Wt 241 lb (109.3 kg)   SpO2 96%   BMI 30.73 kg/m    CC: CPE Subjective:   HPI: Andrew [name] is a 51 y.o. 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 Has remotely seen neurology Andrew [name]). No records available.    Hypothyroidism - notes some fatigue (present over years) and "up tight" anxious feeling. No depressed mood. Decreased libido.    Preventative: Cologuard negative 09/2020 Prostate cancer screening - father with h/o this. DRE/PSA has been reassuring. No nocturia, strong stream.  Lung cancer screening - not eligible  Flu shot - declines COVID vaccine - declines  Tdap 2018  Shingrix - discussed, declines Seat belt use discussed  Sunscreen use discussed. He is outside a lot. Saw derm 2021.  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 Interim medical history since our last visit reviewed. Allergies and medications reviewed and updated. Outpatient Medications Prior to Visit  Medication Sig Dispense Refill   Levocetirizine Dihydrochloride (XYZAL PO) Take by mouth. Takes seasonally     triamcinolone (NASACORT) 55 MCG/ACT AERO nasal inhaler Place 2 sprays into the nose daily. (Patient taking differently: Place 2 sprays into the nose daily. As needed) 1 Inhaler 11   levothyroxine (SYNTHROID) 112 MCG tablet TAKE 1 TABLET (112 MCG TOTAL) BY MOUTH DAILY BEFORE  BREAKFAST. ONCE WEEKLY TAKE EXTRA 1/2 TABLET 110 tablet 0   aspirin EC 81 MG tablet Take 81 mg by mouth daily.     No facility-administered medications prior to visit.     Per HPI unless specifically indicated in ROS section below Review of Systems  Constitutional:  Negative for activity change, appetite change, chills, fatigue, fever and unexpected weight change.  HENT:  Negative for hearing loss.   Eyes:  Negative for visual disturbance.  Respiratory:  Negative for cough, chest tightness, shortness of breath and wheezing.   Cardiovascular:  Negative for chest pain, palpitations and leg swelling.  Gastrointestinal:  Negative for abdominal distention, abdominal pain, blood in stool, constipation, diarrhea, nausea and vomiting.  Genitourinary:  Negative for difficulty urinating and hematuria.  Musculoskeletal:  Negative for arthralgias, myalgias and neck pain.  Skin:  Negative for rash.  Neurological:  Negative for dizziness, seizures, syncope and headaches.  Hematological:  Negative for adenopathy. Does not bruise/bleed easily.  Psychiatric/Behavioral:  Negative for dysphoric mood. The patient is not nervous/anxious.     Objective:  BP 118/82   Pulse 87   Temp 97.9 F (36.6 C) (Temporal)   Ht 6' 2.25" (1.886 m)   Wt 241 lb (109.3 kg)   SpO2 96%   BMI 30.73 kg/m   Wt Readings from Last 3 Encounters:  01/28/22 241 lb (109.3 kg)  08/30/20 239 lb 5 oz (108.6 kg)  08/22/19 240 lb (  108.9 kg)      Physical Exam Vitals and nursing note reviewed.  Constitutional:      General: He is not in acute distress.    Appearance: Normal appearance. He is well-developed. He is not ill-appearing.  HENT:     Head: Normocephalic and atraumatic.     Right Ear: Hearing, tympanic membrane, ear canal and external ear normal.     Left Ear: Hearing, tympanic membrane, ear canal and external ear normal.  Eyes:     General: No scleral icterus.    Extraocular Movements: Extraocular movements intact.      Conjunctiva/sclera: Conjunctivae normal.     Pupils: Pupils are equal, round, and reactive to light.  Neck:     Thyroid: No thyroid mass or thyromegaly.  Cardiovascular:     Rate and Rhythm: Normal rate and regular rhythm.     Pulses: Normal pulses.          Radial pulses are 2+ on the right side and 2+ on the left side.     Heart sounds: Normal heart sounds. No murmur heard. Pulmonary:     Effort: Pulmonary effort is normal. No respiratory distress.     Breath sounds: Normal breath sounds. No wheezing, rhonchi or rales.  Abdominal:     General: Bowel sounds are normal. There is no distension.     Palpations: Abdomen is soft. There is no mass.     Tenderness: There is no abdominal tenderness. There is no guarding or rebound.     Hernia: No hernia is present.  Musculoskeletal:        General: Normal range of motion.     Cervical back: Normal range of motion and neck supple.     Right lower leg: No edema.     Left lower leg: No edema.  Lymphadenopathy:     Cervical: No cervical adenopathy.  Skin:    General: Skin is warm and dry.     Findings: No rash.  Neurological:     General: No focal deficit present.     Mental Status: He is alert and oriented to person, place, and time.  Psychiatric:        Mood and Affect: Mood normal.        Behavior: Behavior normal.        Thought Content: Thought content normal.        Judgment: Judgment normal.       Results for orders placed or performed in visit on 01/21/22  Hepatitis C antibody  Result Value Ref Range   Hepatitis C Ab NON-REACTIVE NON-REACTIVE  TSH  Result Value Ref Range   TSH 6.61 (H) 0.35 - 5.50 uIU/mL  PSA  Result Value Ref Range   PSA 0.72 0.10 - 4.00 ng/mL  Comprehensive metabolic panel  Result Value Ref Range   Sodium 139 135 - 145 mEq/L   Potassium 4.4 3.5 - 5.1 mEq/L   Chloride 102 96 - 112 mEq/L   CO2 30 19 - 32 mEq/L   Glucose, Bld 99 70 - 99 mg/dL   BUN 18 6 - 23 mg/dL   Creatinine, Ser 9.93 0.40 -  1.50 mg/dL   Total Bilirubin 1.1 0.2 - 1.2 mg/dL   Alkaline Phosphatase 56 39 - 117 U/L   AST 17 0 - 37 U/L   ALT 18 0 - 53 U/L   Total Protein 7.4 6.0 - 8.3 g/dL   Albumin 4.6 3.5 - 5.2 g/dL   GFR 71.69 >67.89  mL/min   Calcium 9.7 8.4 - 10.5 mg/dL  Lipid panel  Result Value Ref Range   Cholesterol 169 0 - 200 mg/dL   Triglycerides 562.1 0.0 - 149.0 mg/dL   HDL 30.86 >57.84 mg/dL   VLDL 69.6 0.0 - 29.5 mg/dL   LDL Cholesterol 284 (H) 0 - 99 mg/dL   Total CHOL/HDL Ratio 4    NonHDL 125.74     Assessment & Plan:   Problem List Items Addressed This Visit     Health maintenance examination - Primary (Chronic)    Preventative protocols reviewed and updated unless pt declined. Discussed healthy diet and lifestyle.       HLD (hyperlipidemia)    Chronic, stable off medication. Encouraged increased fiber and legumes to improve LDL levels. The 10-year ASCVD risk score (Arnett DK, et al., 2019) is: 3.1%   Values used to calculate the score:     Age: 66 years     Sex: Male     Is Non-Hispanic African American: No     Diabetic: No     Tobacco smoker: No     Systolic Blood Pressure: 118 mmHg     Is BP treated: No     HDL Cholesterol: 43.2 mg/dL     Total Cholesterol: 169 mg/dL       History of stroke    Unclear if PFO related, briefly saw neurology. Continue aspirin 81mg  daily.       Hypothyroidism (acquired)    Chronic, TSH elevated. Will increase levothyroxine from daily to daily, reassess control in 4-6 wks with labs.       Relevant Medications   levothyroxine (SYNTHROID) 125 MCG tablet   Other Relevant Orders   TSH   Chronic fatigue    Longstanding issue. Will evaluate for reversible causes of fatigue at upcoming labs.       Relevant Orders   Testosterone   Vitamin B12   CBC with Differential/Platelet   VITAMIN D 25 Hydroxy (Vit-D Deficiency, Fractures)     Meds ordered this encounter  Medications   levothyroxine (SYNTHROID) 125 MCG tablet     Sig: Take 1 tablet (125 mcg total) by mouth daily before breakfast. Once weekly take extra 1/2 tablet    Dispense:  90 tablet    Refill:  1   Orders Placed This Encounter  Procedures   Testosterone    Standing Status:   Future    Standing Expiration Date:   01/29/2023   Vitamin B12    Standing Status:   Future    Standing Expiration Date:   01/29/2023   CBC with Differential/Platelet    Standing Status:   Future    Standing Expiration Date:   01/29/2023   VITAMIN D 25 Hydroxy (Vit-D Deficiency, Fractures)    Standing Status:   Future    Standing Expiration Date:   01/29/2023   TSH    Standing Status:   Future    Standing Expiration Date:   01/29/2023     Patient instructions: Increase levothyroxine to 01/31/2023 daily. Return in 4-6 weeks for 8am labs for repeat thyroid and fatigue labs.  Good to see you today Return as needed or in 1 year for next physical.   Follow up plan: Return in about 1 year (around 01/29/2023) for annual exam, prior fasting for blood work.  01/31/2023, MD

## 2022-01-28 NOTE — Assessment & Plan Note (Signed)
Chronic, TSH elevated. Will increase levothyroxine from daily to daily, reassess control in 4-6 wks with labs.

## 2022-01-28 NOTE — Assessment & Plan Note (Signed)
Chronic, stable off medication. Encouraged increased fiber and legumes to improve LDL levels. The 10-year ASCVD risk score (Arnett DK, et al., 2019) is: 3.1%   Values used to calculate the score:     Age: 51 years     Sex: Male     Is Non-Hispanic African American: No     Diabetic: No     Tobacco smoker: No     Systolic Blood Pressure: 118 mmHg     Is BP treated: No     HDL Cholesterol: 43.2 mg/dL     Total Cholesterol: 169 mg/dL

## 2022-01-28 NOTE — Assessment & Plan Note (Addendum)
Unclear if PFO related, briefly saw neurology. Continue aspirin 81mg  daily.

## 2022-01-28 NOTE — Assessment & Plan Note (Signed)
Preventative protocols reviewed and updated unless pt declined. Discussed healthy diet and lifestyle.  

## 2022-01-30 ENCOUNTER — Other Ambulatory Visit: Payer: Self-pay | Admitting: Family Medicine

## 2022-01-30 NOTE — Telephone Encounter (Signed)
Change in strength. New rx sent 01/28/22, #90/1.  (See 01/28/22 OV notes)

## 2022-03-04 ENCOUNTER — Other Ambulatory Visit (INDEPENDENT_AMBULATORY_CARE_PROVIDER_SITE_OTHER): Payer: BC Managed Care – PPO

## 2022-03-04 DIAGNOSIS — R5382 Chronic fatigue, unspecified: Secondary | ICD-10-CM | POA: Diagnosis not present

## 2022-03-04 DIAGNOSIS — E039 Hypothyroidism, unspecified: Secondary | ICD-10-CM | POA: Diagnosis not present

## 2022-03-04 LAB — CBC WITH DIFFERENTIAL/PLATELET
Basophils Absolute: 0 10*3/uL (ref 0.0–0.1)
Basophils Relative: 0.8 % (ref 0.0–3.0)
Eosinophils Absolute: 0.3 10*3/uL (ref 0.0–0.7)
Eosinophils Relative: 6.4 % — ABNORMAL HIGH (ref 0.0–5.0)
HCT: 42.8 % (ref 39.0–52.0)
Hemoglobin: 14.9 g/dL (ref 13.0–17.0)
Lymphocytes Relative: 34.5 % (ref 12.0–46.0)
Lymphs Abs: 1.8 10*3/uL (ref 0.7–4.0)
MCHC: 34.8 g/dL (ref 30.0–36.0)
MCV: 91.6 fl (ref 78.0–100.0)
Monocytes Absolute: 0.6 10*3/uL (ref 0.1–1.0)
Monocytes Relative: 12.2 % — ABNORMAL HIGH (ref 3.0–12.0)
Neutro Abs: 2.3 10*3/uL (ref 1.4–7.7)
Neutrophils Relative %: 46.1 % (ref 43.0–77.0)
Platelets: 281 10*3/uL (ref 150.0–400.0)
RBC: 4.67 Mil/uL (ref 4.22–5.81)
RDW: 12.6 % (ref 11.5–15.5)
WBC: 5.1 10*3/uL (ref 4.0–10.5)

## 2022-03-04 LAB — VITAMIN D 25 HYDROXY (VIT D DEFICIENCY, FRACTURES): VITD: 24.25 ng/mL — ABNORMAL LOW (ref 30.00–100.00)

## 2022-03-04 LAB — VITAMIN B12: Vitamin B-12: 258 pg/mL (ref 211–911)

## 2022-03-04 LAB — TESTOSTERONE: Testosterone: 248.3 ng/dL — ABNORMAL LOW (ref 300.00–890.00)

## 2022-03-04 LAB — TSH: TSH: 1.03 u[IU]/mL (ref 0.35–5.50)

## 2022-03-09 ENCOUNTER — Other Ambulatory Visit: Payer: Self-pay | Admitting: Family Medicine

## 2022-03-09 DIAGNOSIS — E559 Vitamin D deficiency, unspecified: Secondary | ICD-10-CM | POA: Insufficient documentation

## 2022-03-09 DIAGNOSIS — R7989 Other specified abnormal findings of blood chemistry: Secondary | ICD-10-CM | POA: Insufficient documentation

## 2022-03-09 DIAGNOSIS — E291 Testicular hypofunction: Secondary | ICD-10-CM | POA: Insufficient documentation

## 2022-03-09 DIAGNOSIS — E538 Deficiency of other specified B group vitamins: Secondary | ICD-10-CM | POA: Insufficient documentation

## 2022-03-09 MED ORDER — VITAMIN D3 25 MCG (1000 UT) PO CAPS: 1.0000 | ORAL_CAPSULE | Freq: Every day | ORAL | Status: AC

## 2022-03-09 MED ORDER — B-12 1000 MCG SL SUBL: 1.0000 | SUBLINGUAL_TABLET | Freq: Every day | SUBLINGUAL | Status: AC

## 2022-03-25 ENCOUNTER — Other Ambulatory Visit (INDEPENDENT_AMBULATORY_CARE_PROVIDER_SITE_OTHER): Payer: BC Managed Care – PPO

## 2022-03-25 DIAGNOSIS — R7989 Other specified abnormal findings of blood chemistry: Secondary | ICD-10-CM | POA: Diagnosis not present

## 2022-03-25 LAB — LUTEINIZING HORMONE: LH: 3.6 m[IU]/mL (ref 1.50–9.30)

## 2022-03-25 LAB — IBC PANEL
Iron: 88 ug/dL (ref 42–165)
Saturation Ratios: 30.4 % (ref 20.0–50.0)
TIBC: 289.8 ug/dL (ref 250.0–450.0)
Transferrin: 207 mg/dL — ABNORMAL LOW (ref 212.0–360.0)

## 2022-03-25 LAB — FOLLICLE STIMULATING HORMONE: FSH: 4.4 m[IU]/mL (ref 1.4–18.1)

## 2022-03-28 LAB — TESTOS,TOTAL,FREE AND SHBG (FEMALE)
Free Testosterone: 56.9 pg/mL (ref 35.0–155.0)
Sex Hormone Binding: 10 nmol/L (ref 10–50)
Testosterone, Total, LC-MS-MS: 236 ng/dL — ABNORMAL LOW (ref 250–1100)

## 2022-04-13 ENCOUNTER — Encounter: Payer: Self-pay | Admitting: Family Medicine

## 2022-07-24 ENCOUNTER — Other Ambulatory Visit: Payer: Self-pay | Admitting: Family Medicine

## 2022-07-24 DIAGNOSIS — E039 Hypothyroidism, unspecified: Secondary | ICD-10-CM

## 2023-01-13 ENCOUNTER — Encounter (INDEPENDENT_AMBULATORY_CARE_PROVIDER_SITE_OTHER): Payer: Self-pay

## 2023-01-17 ENCOUNTER — Other Ambulatory Visit: Payer: Self-pay | Admitting: Family Medicine

## 2023-01-17 DIAGNOSIS — E039 Hypothyroidism, unspecified: Secondary | ICD-10-CM

## 2023-01-23 ENCOUNTER — Other Ambulatory Visit: Payer: Self-pay | Admitting: Family Medicine

## 2023-01-23 DIAGNOSIS — Z125 Encounter for screening for malignant neoplasm of prostate: Secondary | ICD-10-CM

## 2023-01-23 DIAGNOSIS — E785 Hyperlipidemia, unspecified: Secondary | ICD-10-CM

## 2023-01-23 DIAGNOSIS — E291 Testicular hypofunction: Secondary | ICD-10-CM

## 2023-01-23 DIAGNOSIS — E559 Vitamin D deficiency, unspecified: Secondary | ICD-10-CM

## 2023-01-23 DIAGNOSIS — E538 Deficiency of other specified B group vitamins: Secondary | ICD-10-CM

## 2023-01-23 DIAGNOSIS — E039 Hypothyroidism, unspecified: Secondary | ICD-10-CM

## 2023-01-25 ENCOUNTER — Other Ambulatory Visit (INDEPENDENT_AMBULATORY_CARE_PROVIDER_SITE_OTHER): Payer: BC Managed Care – PPO

## 2023-01-25 DIAGNOSIS — E039 Hypothyroidism, unspecified: Secondary | ICD-10-CM | POA: Diagnosis not present

## 2023-01-25 DIAGNOSIS — E291 Testicular hypofunction: Secondary | ICD-10-CM

## 2023-01-25 DIAGNOSIS — E559 Vitamin D deficiency, unspecified: Secondary | ICD-10-CM | POA: Diagnosis not present

## 2023-01-25 DIAGNOSIS — Z125 Encounter for screening for malignant neoplasm of prostate: Secondary | ICD-10-CM

## 2023-01-25 DIAGNOSIS — E538 Deficiency of other specified B group vitamins: Secondary | ICD-10-CM | POA: Diagnosis not present

## 2023-01-25 DIAGNOSIS — E785 Hyperlipidemia, unspecified: Secondary | ICD-10-CM | POA: Diagnosis not present

## 2023-01-25 LAB — PSA: PSA: 0.88 ng/mL (ref 0.10–4.00)

## 2023-01-25 LAB — COMPREHENSIVE METABOLIC PANEL WITH GFR
ALT: 27 U/L (ref 0–53)
AST: 19 U/L (ref 0–37)
Albumin: 4.3 g/dL (ref 3.5–5.2)
Alkaline Phosphatase: 57 U/L (ref 39–117)
BUN: 16 mg/dL (ref 6–23)
CO2: 28 meq/L (ref 19–32)
Calcium: 9.4 mg/dL (ref 8.4–10.5)
Chloride: 101 meq/L (ref 96–112)
Creatinine, Ser: 1.16 mg/dL (ref 0.40–1.50)
GFR: 72.67 mL/min
Glucose, Bld: 97 mg/dL (ref 70–99)
Potassium: 4.4 meq/L (ref 3.5–5.1)
Sodium: 136 meq/L (ref 135–145)
Total Bilirubin: 0.7 mg/dL (ref 0.2–1.2)
Total Protein: 6.9 g/dL (ref 6.0–8.3)

## 2023-01-25 LAB — LIPID PANEL
Cholesterol: 172 mg/dL (ref 0–200)
HDL: 44.1 mg/dL (ref >39.00)
LDL Cholesterol: 98 mg/dL (ref 0–99)
NonHDL: 127.48
Total CHOL/HDL Ratio: 4
Triglycerides: 148 mg/dL (ref 0.0–149.0)
VLDL: 29.6 mg/dL (ref 0.0–40.0)

## 2023-01-25 LAB — VITAMIN D 25 HYDROXY (VIT D DEFICIENCY, FRACTURES): VITD: 20.49 ng/mL — ABNORMAL LOW (ref 30.00–100.00)

## 2023-01-25 LAB — TSH: TSH: 2.96 u[IU]/mL (ref 0.35–5.50)

## 2023-01-25 LAB — VITAMIN B12: Vitamin B-12: 509 pg/mL (ref 211–911)

## 2023-02-01 ENCOUNTER — Ambulatory Visit (INDEPENDENT_AMBULATORY_CARE_PROVIDER_SITE_OTHER): Payer: BC Managed Care – PPO | Admitting: Family Medicine

## 2023-02-01 ENCOUNTER — Encounter: Payer: Self-pay | Admitting: Family Medicine

## 2023-02-01 VITALS — BP 118/78 | HR 73 | Temp 97.8°F | Ht 74.0 in | Wt 244.0 lb

## 2023-02-01 DIAGNOSIS — E039 Hypothyroidism, unspecified: Secondary | ICD-10-CM | POA: Diagnosis not present

## 2023-02-01 DIAGNOSIS — E785 Hyperlipidemia, unspecified: Secondary | ICD-10-CM | POA: Diagnosis not present

## 2023-02-01 DIAGNOSIS — Z0000 Encounter for general adult medical examination without abnormal findings: Secondary | ICD-10-CM | POA: Diagnosis not present

## 2023-02-01 DIAGNOSIS — E291 Testicular hypofunction: Secondary | ICD-10-CM

## 2023-02-01 DIAGNOSIS — E559 Vitamin D deficiency, unspecified: Secondary | ICD-10-CM

## 2023-02-01 DIAGNOSIS — Z8673 Personal history of transient ischemic attack (TIA), and cerebral infarction without residual deficits: Secondary | ICD-10-CM

## 2023-02-01 DIAGNOSIS — J302 Other seasonal allergic rhinitis: Secondary | ICD-10-CM

## 2023-02-01 DIAGNOSIS — E538 Deficiency of other specified B group vitamins: Secondary | ICD-10-CM

## 2023-02-01 DIAGNOSIS — Q2112 Patent foramen ovale: Secondary | ICD-10-CM

## 2023-02-01 MED ORDER — LEVOTHYROXINE SODIUM 125 MCG PO TABS
125.0000 ug | ORAL_TABLET | Freq: Every day | ORAL | 4 refills | Status: AC
Start: 2023-02-01 — End: ?

## 2023-02-01 MED ORDER — ASPIRIN 81 MG PO TBEC: 81.0000 mg | DELAYED_RELEASE_TABLET | Freq: Every day | ORAL | Status: AC

## 2023-02-01 NOTE — Progress Notes (Signed)
Ph: [phone] Fax: [phone]   Patient ID: Andrew [name], male    DOB: 09-09-1970, 52 y.o.   MRN: [id number]  This visit was conducted in person.  BP 118/78   Pulse 73   Temp 97.8 F (36.6 C) (Temporal)   Ht 6\' 2"  (1.88 m)   Wt 244 lb (110.7 kg)   SpO2 97%   BMI 31.33 kg/m    CC: CPE Subjective:   HPI: Andrew [name] is a 52 y.o. 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 Aggrenox caused headache. Continues aspirin 81mg  daily. Has remotely seen neurology Pearlean Brownie).    Hypothyroidism - continues levothyroxine with extra 1/2 tablet once weekly.   He's not taking separate vit D - instead taking daily MVI with 1000 IU vit D.   Preventative: Colon cancer screen - Cologuard negative 09/2020 Prostate cancer screening - father with h/o this. DRE/PSA has been reassuring. No nocturia, strong stream.  Lung cancer screening - not eligible  Flu shot - declines COVID vaccine - declines  Tdap 2018  Shingrix - discussed, declines Seat belt use discussed  Sunscreen use discussed. No changing moles on skin. Saw derm 2021.  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 Interim medical history since our last visit reviewed. Allergies and medications reviewed and updated. Outpatient Medications Prior to Visit  Medication Sig Dispense Refill   Cholecalciferol (VITAMIN D3) 25 MCG (1000 UT) CAPS Take 1 capsule (1,000 Units total) by mouth daily. 30 capsule    Cyanocobalamin (B-12)  1000 MCG SUBL Place 1 tablet under the tongue daily.     Levocetirizine Dihydrochloride (XYZAL PO) Take by mouth. Takes seasonally     triamcinolone (NASACORT) 55 MCG/ACT AERO nasal inhaler Place 2 sprays into the nose daily. (Patient taking differently: Place 2 sprays into the nose daily. As needed) 1 Inhaler 11   levothyroxine (SYNTHROID) 125 MCG tablet TAKE 1 TABLET (125 MCG TOTAL) BY MOUTH DAILY BEFORE BREAKFAST. ONCE WEEKLY TAKE EXTRA 1/2 TABLET 90 tablet 0   No facility-administered medications prior to visit.     Per HPI unless specifically indicated in ROS section below Review of Systems  Constitutional:  Negative for activity change, appetite change, chills, fatigue, fever and unexpected weight change.  HENT:  Negative for hearing loss.   Eyes:  Negative for visual disturbance.  Respiratory:  Negative for cough, chest tightness, shortness of breath and wheezing.   Cardiovascular:  Negative for chest pain, palpitations and leg swelling.  Gastrointestinal:  Negative for abdominal distention, abdominal pain, blood in stool, constipation, diarrhea, nausea and vomiting.  Genitourinary:  Negative for difficulty urinating and hematuria.  Musculoskeletal:  Negative for arthralgias, myalgias and neck pain.  Skin:  Negative for rash.  Neurological:  Negative for dizziness, seizures, syncope and headaches.  Hematological:  Negative for adenopathy. Does not bruise/bleed easily.  Psychiatric/Behavioral:  Negative for dysphoric mood. The patient is not nervous/anxious.     Objective:  BP 118/78  Pulse 73   Temp 97.8 F (36.6 C) (Temporal)   Ht 6\' 2"  (1.88 m)   Wt 244 lb (110.7 kg)   SpO2 97%   BMI 31.33 kg/m   Wt Readings from Last 3 Encounters:  02/01/23 244 lb (110.7 kg)  01/28/22 241 lb (109.3 kg)  08/30/20 239 lb 5 oz (108.6 kg)      Physical Exam Vitals and nursing note reviewed.  Constitutional:      General: He is not in acute distress.    Appearance: Normal appearance.  He is well-developed. He is not ill-appearing.  HENT:     Head: Normocephalic and atraumatic.     Right Ear: Hearing, tympanic membrane, ear canal and external ear normal.     Left Ear: Hearing, tympanic membrane, ear canal and external ear normal.     Mouth/Throat:     Mouth: Mucous membranes are moist.     Pharynx: Oropharynx is clear. No oropharyngeal exudate or posterior oropharyngeal erythema.  Eyes:     General: No scleral icterus.    Extraocular Movements: Extraocular movements intact.     Conjunctiva/sclera: Conjunctivae normal.     Pupils: Pupils are equal, round, and reactive to light.  Neck:     Thyroid: No thyroid mass or thyromegaly.  Cardiovascular:     Rate and Rhythm: Normal rate and regular rhythm.     Pulses: Normal pulses.          Radial pulses are 2+ on the right side and 2+ on the left side.     Heart sounds: Normal heart sounds. No murmur heard. Pulmonary:     Effort: Pulmonary effort is normal. No respiratory distress.     Breath sounds: Normal breath sounds. No wheezing, rhonchi or rales.  Abdominal:     General: Bowel sounds are normal. There is no distension.     Palpations: Abdomen is soft. There is no mass.     Tenderness: There is no abdominal tenderness. There is no guarding or rebound.     Hernia: No hernia is present.  Musculoskeletal:        General: Normal range of motion.     Cervical back: Normal range of motion and neck supple.     Right lower leg: No edema.     Left lower leg: No edema.  Lymphadenopathy:     Cervical: No cervical adenopathy.  Skin:    General: Skin is warm and dry.     Findings: No rash.  Neurological:     General: No focal deficit present.     Mental Status: He is alert and oriented to person, place, and time.  Psychiatric:        Mood and Affect: Mood normal.        Behavior: Behavior normal.        Thought Content: Thought content normal.        Judgment: Judgment normal.       Results for orders placed or  performed in visit on 01/25/23  Testosterone, Free, Total, SHBG  Result Value Ref Range   Testosterone 277 264 - 916 ng/dL   Testosterone, Free 16.1 7.2 - 24.0 pg/mL   Sex Hormone Binding 13.2 (L) 19.3 - 76.4 nmol/L  PSA  Result Value Ref Range   PSA 0.88 0.10 - 4.00 ng/mL  VITAMIN D 25 Hydroxy (Vit-D Deficiency, Fractures)  Result Value Ref Range   VITD 20.49 (L) 30.00 - 100.00 ng/mL  Vitamin B12  Result Value Ref  Range   Vitamin B-12 509 211 - 911 pg/mL  TSH  Result Value Ref Range   TSH 2.96 0.35 - 5.50 uIU/mL  Comprehensive metabolic panel  Result Value Ref Range   Sodium 136 135 - 145 mEq/L   Potassium 4.4 3.5 - 5.1 mEq/L   Chloride 101 96 - 112 mEq/L   CO2 28 19 - 32 mEq/L   Glucose, Bld 97 70 - 99 mg/dL   BUN 16 6 - 23 mg/dL   Creatinine, Ser 0.98 0.40 - 1.50 mg/dL   Total Bilirubin 0.7 0.2 - 1.2 mg/dL   Alkaline Phosphatase 57 39 - 117 U/L   AST 19 0 - 37 U/L   ALT 27 0 - 53 U/L   Total Protein 6.9 6.0 - 8.3 g/dL   Albumin 4.3 3.5 - 5.2 g/dL   GFR 11.91 >47.82 mL/min   Calcium 9.4 8.4 - 10.5 mg/dL  Lipid panel  Result Value Ref Range   Cholesterol 172 0 - 200 mg/dL   Triglycerides 956.2 0.0 - 149.0 mg/dL   HDL 13.08 >65.78 mg/dL   VLDL 46.9 0.0 - 62.9 mg/dL   LDL Cholesterol 98 0 - 99 mg/dL   Total CHOL/HDL Ratio 4    NonHDL 127.48     Assessment & Plan:   Problem List Items Addressed This Visit     Health maintenance examination - Primary (Chronic)    Preventative protocols reviewed and updated unless pt declined. Discussed healthy diet and lifestyle.       HLD (hyperlipidemia)    Chronic, off med.  The 10-year ASCVD risk score (Arnett DK, et al., 2019) is: 3.4%   Values used to calculate the score:     Age: 67 years     Sex: Male     Is Non-Hispanic African American: No     Diabetic: No     Tobacco smoker: No     Systolic Blood Pressure: 118 mmHg     Is BP treated: No     HDL Cholesterol: 44.1 mg/dL     Total Cholesterol: 172 mg/dL        Relevant Medications   aspirin EC 81 MG tablet   History of stroke    H/o stroke 2005, workup unrevealing.  Continues aspirin 81mg  daily.  Small PFO, thought non-contributory      PFO (patent foramen ovale)    Small, thought non-contributory      Relevant Medications   aspirin EC 81 MG tablet   Hypothyroidism (acquired)    Chronic, stable on current regimen - continue.       Relevant Medications   levothyroxine (SYNTHROID) 125 MCG tablet   Seasonal allergic rhinitis    Continue antihistamine and INS.       Vitamin D deficiency    Taking MVI with 1000 international units  vit D3 Will recommend add extra 1000 international units as levels remain low.       Hypogonadism in male    Mild, will avoid treatment at this time given risks of treatment.       Vitamin B12 deficiency    Continue MVI         Meds ordered this encounter  Medications   aspirin EC 81 MG tablet    Sig: Take 1 tablet (81 mg total) by mouth daily. Swallow whole.   levothyroxine (SYNTHROID) 125 MCG tablet    Sig: Take 1 tablet (125 mcg total) by mouth daily before breakfast. Once weekly take extra 1/2  tablet    Dispense:  100 tablet    Refill:  4    No orders of the defined types were placed in this encounter.   Patient Instructions  Continue levothyroxine.  Good to see you today  Return in 1 year for next physical   Follow up plan: Return in about 1 year (around 02/01/2024) for annual exam, prior fasting for blood work.  Eustaquio Boyden, MD

## 2023-02-01 NOTE — Patient Instructions (Addendum)
Continue levothyroxine.  Good to see you today  Return in 1 year for next physical

## 2023-02-01 NOTE — Assessment & Plan Note (Signed)
Chronic, stable on current regimen - continue. 

## 2023-02-01 NOTE — Assessment & Plan Note (Signed)
Continue antihistamine and INS.

## 2023-02-01 NOTE — Assessment & Plan Note (Signed)
H/o stroke 2005, workup unrevealing.  Continues aspirin 81mg  daily.  Small PFO, thought non-contributory

## 2023-02-01 NOTE — Assessment & Plan Note (Signed)
Chronic, off med.  The 10-year ASCVD risk score (Arnett DK, et al., 2019) is: 3.4%   Values used to calculate the score:     Age: 52 years     Sex: Male     Is Non-Hispanic African American: No     Diabetic: No     Tobacco smoker: No     Systolic Blood Pressure: 118 mmHg     Is BP treated: No     HDL Cholesterol: 44.1 mg/dL     Total Cholesterol: 172 mg/dL

## 2023-02-01 NOTE — Assessment & Plan Note (Addendum)
Preventative protocols reviewed and updated unless pt declined. Discussed healthy diet and lifestyle.  

## 2023-02-01 NOTE — Assessment & Plan Note (Addendum)
Taking MVI with 1000 international units  vit D3 Will recommend add extra 1000 international units as levels remain low.

## 2023-02-01 NOTE — Assessment & Plan Note (Signed)
Mild, will avoid treatment at this time given risks of treatment.

## 2023-02-01 NOTE — Assessment & Plan Note (Signed)
Continue MVI 

## 2023-02-01 NOTE — Assessment & Plan Note (Signed)
Small, thought non-contributory
